# Patient Record
Sex: Female | Born: 1990 | Race: Black or African American | Hispanic: No | Marital: Married | Smoking: Never smoker
Health system: Southern US, Community
[De-identification: ages and names within clinical notes are randomized; demographics above are authoritative.]

## PROBLEM LIST (undated history)

## (undated) DIAGNOSIS — E282 Polycystic ovarian syndrome: Secondary | ICD-10-CM

## (undated) HISTORY — DX: Polycystic ovarian syndrome: E28.2

---

## 2019-07-25 ENCOUNTER — Encounter (HOSPITAL_COMMUNITY): Payer: Self-pay | Admitting: Emergency Medicine

## 2019-07-25 ENCOUNTER — Emergency Department (HOSPITAL_COMMUNITY): Payer: No Typology Code available for payment source

## 2019-07-25 ENCOUNTER — Emergency Department (HOSPITAL_COMMUNITY)
Admission: EM | Admit: 2019-07-25 | Discharge: 2019-07-25 | Disposition: A | Discharge status: Home / Self Care | Payer: No Typology Code available for payment source | Attending: Emergency Medicine | Admitting: Emergency Medicine

## 2019-07-25 DIAGNOSIS — Y990 Civilian activity done for income or pay: Secondary | ICD-10-CM | POA: Diagnosis not present

## 2019-07-25 DIAGNOSIS — R200 Anesthesia of skin: Secondary | ICD-10-CM | POA: Diagnosis not present

## 2019-07-25 DIAGNOSIS — S61012A Laceration without foreign body of left thumb without damage to nail, initial encounter: Secondary | ICD-10-CM

## 2019-07-25 DIAGNOSIS — Y939 Activity, unspecified: Secondary | ICD-10-CM | POA: Diagnosis not present

## 2019-07-25 DIAGNOSIS — Y929 Unspecified place or not applicable: Secondary | ICD-10-CM | POA: Insufficient documentation

## 2019-07-25 DIAGNOSIS — W269XXA Contact with unspecified sharp object(s), initial encounter: Secondary | ICD-10-CM | POA: Diagnosis not present

## 2019-07-25 IMAGING — CR DG FINGER THUMB 2+V*L*
3 series · 3 of 3 positions shown · non-contrast
Comparison: None.

CLINICAL DATA: Laceration

EXAM:
LEFT THUMB 2+V

[finger ap]
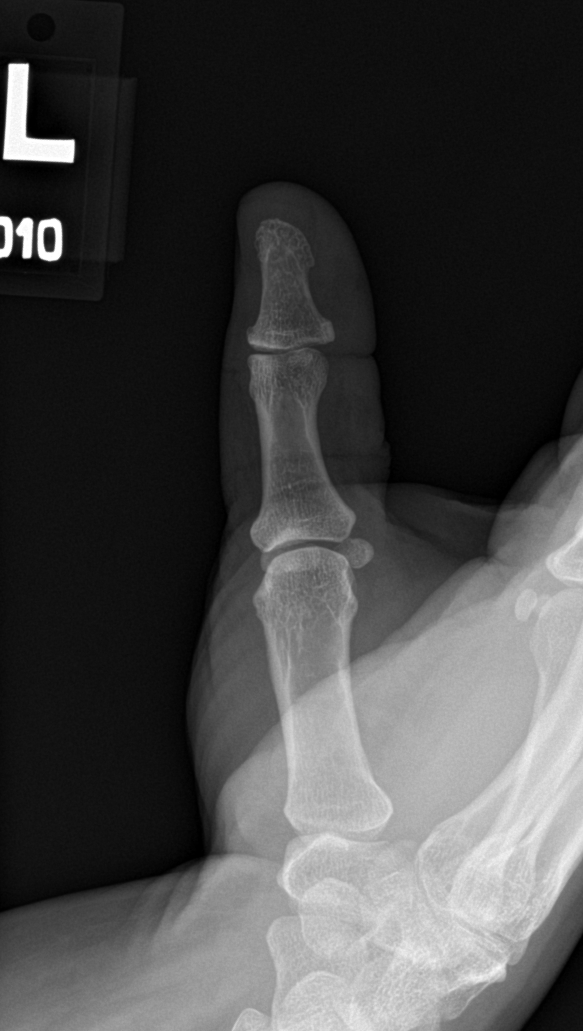

[finger obl]
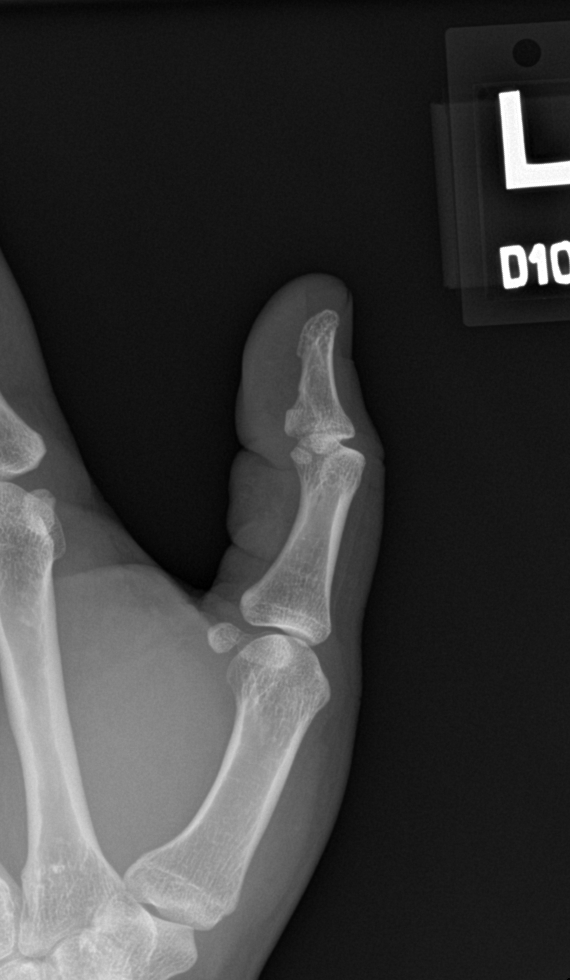

[finger lat]
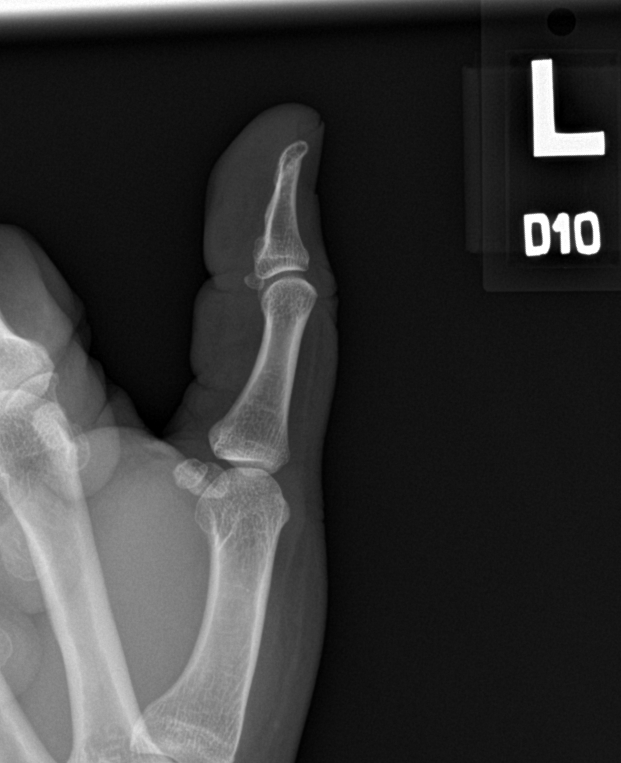

[3 of 3 positions shown; findings below may reference images not displayed]

FINDINGS: There is no evidence of fracture or dislocation. There is no
evidence of arthropathy or other focal bone abnormality. Soft
tissues are unremarkable.
IMPRESSION: Negative.

## 2019-07-25 MED ORDER — LIDOCAINE HCL (PF) 1 % IJ SOLN
10.0000 mL | Freq: Once | INTRAMUSCULAR | Status: AC
  Administered 2019-07-25: 10 mL
  Filled 2019-07-25: qty 10

## 2019-07-25 MED ORDER — ACETAMINOPHEN 500 MG PO TABS
500.0000 mg | ORAL_TABLET | Freq: Once | ORAL | Status: AC
  Administered 2019-07-25: 500 mg via ORAL
  Filled 2019-07-25: qty 1

## 2019-07-25 NOTE — ED Provider Notes (Signed)
[facility] EMERGENCY DEPARTMENT Provider Note   CSN: [id number] Arrival date & time: 07/25/19  1426     History Chief Complaint  Patient presents with  . Laceration    Lindsey Schmitt is a 29 y.o. female with no pertinent past medical history that presents to the emergency from today for a left thumb laceration that occurred a couple hours ago.  She states that she was scraped with a pan she was using at work.  She states there was a sharp edge to the pan, she denies any foreign bodies.  She states that she wrapped the wound and it stopped bleeding.  She states that her whole thumb is numb, and cannot feel anything on her thumb.  She states that she is not able to move her thumb at all due to pain.  She denies any other symptoms at this time.  Denies any shortness of breath, nausea, vomiting, chest pain, fevers, chills. Pt has gotten her tdap last year.   The history is provided by the patient.       History reviewed. No pertinent past medical history.  There are no problems to display for this patient.      OB History   No obstetric history on file.     No family history on file.  Social History   Tobacco Use  . Smoking status: Not on file  Substance Use Topics  . Alcohol use: Not on file  . Drug use: Not on file    Home Medications Prior to Admission medications   Not on File    Allergies    Amoxicillin  Review of Systems   Review of Systems  Constitutional: Negative for appetite change, chills, diaphoresis, fatigue and fever.  Eyes: Negative for pain.  Respiratory: Negative for shortness of breath.   Cardiovascular: Negative for chest pain and leg swelling.  Gastrointestinal: Negative for nausea and vomiting.  Skin: Positive for wound (Left thumb lac).  Neurological: Positive for numbness (Left thumb). Negative for tremors, weakness and light-headedness.  Psychiatric/Behavioral: Negative for behavioral problems and confusion.     Physical Exam Updated Vital Signs BP 132/86 (BP Location: Right Arm)   Pulse 74   Temp 98.6 F (37 C) (Oral)   Resp 18   SpO2 98%   Physical Exam Constitutional:      General: She is not in acute distress.    Appearance: Normal appearance. She is not ill-appearing, toxic-appearing or diaphoretic.  Cardiovascular:     Rate and Rhythm: Normal rate and regular rhythm.     Pulses: Normal pulses.     Heart sounds: Normal heart sounds.  Pulmonary:     Effort: Pulmonary effort is normal.  Musculoskeletal:        General: Swelling, tenderness and signs of injury present.     Comments: Laceration to left thumb above MCP joint.  Patient states she is unable to feel distally, however light and dull sensation distiction intact. Is able to flex and extend all thumb joints. Strength 5/5 on all thumb joints and thumb.   Bleeding is controlled.  Radial pulse 2+.  No injury elsewhere.  Normal strength of other digits, wrist, arm.  Normal sensation elsewhere.  Skin:    General: Skin is warm and dry.     Capillary Refill: Capillary refill takes less than 2 seconds.  Neurological:     General: No focal deficit present.     Mental Status: She is alert and oriented to person, place,  and time.  Psychiatric:        Mood and Affect: Mood normal.        Thought Content: Thought content normal.     ED Results / Procedures / Treatments   Labs (all labs ordered are listed, but only abnormal results are displayed) Labs Reviewed - No data to display  EKG None  Radiology DG Finger Thumb Left  Result Date: 07/25/2019 CLINICAL DATA:  Laceration EXAM: LEFT THUMB 2+V COMPARISON:  None. FINDINGS: There is no evidence of fracture or dislocation. There is no evidence of arthropathy or other focal bone abnormality. Soft tissues are unremarkable. IMPRESSION: Negative. Electronically Signed   By: Donavan Foil M.D.   On: 07/25/2019 17:27    Procedures .Nerve Block  Date/Time: 07/25/2019 5:34  PM Performed by: Lindsey Client, PA-C Authorized by: Lindsey Client, PA-C   Consent:    Consent obtained:  Verbal   Consent given by:  Patient   Risks discussed:  Allergic reaction, nerve damage, swelling, unsuccessful block, pain, intravenous injection and bleeding   Alternatives discussed:  No treatment Indications:    Indications:  Pain relief and procedural anesthesia Location:    Body area:  Upper extremity   Upper extremity nerve:  Metacarpal   Laterality:  Left Pre-procedure details:    Skin preparation:  Povidone-iodine Skin anesthesia (see MAR for exact dosages):    Skin anesthesia method:  None Procedure details (see MAR for exact dosages):    Block needle gauge:  24 G   Anesthetic injected:  Lidocaine 1% w/o epi   Steroid injected:  None   Additive injected:  None   Injection procedure:  Anatomic landmarks identified, anatomic landmarks palpated, incremental injection and introduced needle Post-procedure details:    Dressing:  None   Outcome:  Anesthesia achieved   Patient tolerance of procedure:  Tolerated well, no immediate complications .Marland KitchenLaceration Repair  Date/Time: 07/25/2019 9:55 PM Performed by: Lindsey Client, PA-C Authorized by: Lindsey Client, PA-C   Consent:    Consent obtained:  Verbal   Consent given by:  Patient   Risks discussed:  Infection, need for additional repair, pain, poor cosmetic result and poor wound healing   Alternatives discussed:  No treatment and delayed treatment Universal protocol:    Procedure explained and questions answered to patient or proxy's satisfaction: yes     Relevant documents present and verified: yes     Test results available and properly labeled: yes     Imaging studies available: yes     Required blood products, implants, devices, and special equipment available: yes     Site/side marked: yes     Immediately prior to procedure, a time out was called: yes     Patient identity confirmed:  Verbally with  patient Anesthesia (see MAR for exact dosages):    Anesthesia method:  Local infiltration Laceration details:    Location:  Finger   Finger location:  L thumb   Length (cm):  2   Depth (mm):  1 Pre-procedure details:    Preparation:  Patient was prepped and draped in usual sterile fashion and imaging obtained to evaluate for foreign bodies Exploration:    Hemostasis achieved with:  Epinephrine and direct pressure   Wound exploration: wound explored through full range of motion and entire depth of wound probed and visualized     Contaminated: no   Treatment:    Area cleansed with:  Saline   Amount of cleaning:  Extensive   Irrigation solution:  Sterile saline   Irrigation volume:  400 cc   Irrigation method:  Syringe   Visualized foreign bodies/material removed: no   Skin repair:    Repair method:  Sutures   Suture size:  5-0   Suture material:  Prolene   Suture technique:  Simple interrupted   Number of sutures:  2 Approximation:    Approximation:  Close Post-procedure details:    Dressing:  Adhesive bandage and splint for protection   (including critical care time)  Medications Ordered in ED Medications  acetaminophen (TYLENOL) tablet 500 mg (500 mg Oral Given 07/25/19 1626)  lidocaine (PF) (XYLOCAINE) 1 % injection 10 mL (10 mLs Infiltration Given 07/25/19 1905)    ED Course  I have reviewed the triage vital signs and the nursing notes.  Pertinent labs & imaging results that were available during my care of the patient were reviewed by me and considered in my medical decision making (see chart for details).    MDM Rules/Calculators/A&P                      Lumi Winslett is a 29 y.o. female with no pertinent past medical history that presents to the emergency from today for a left thumb laceration superior to the MCP joint that occurred a couple hours ago. Laceration is superficial and unlikely to have any nerve injury at this time. IS able to move all joints, normal  strength. Pt is neurovascualrly intact, is able to feel light and dull sensation. Normal pulses and strength.  Negative xray. Nerve block and sutures (See procedure note). Pt splinted.   Doubt need for further emergent work up at this time. I explained the diagnosis and have given explicit precautions to return to the ER including for any other new or worsening symptoms. The patient understands and accepts the medical plan as it's been dictated and I have answered their questions. Discharge instructions concerning home care and prescriptions have been given. The patient is STABLE and is discharged to home in good condition. Given instructions on lac repair and when to come back to remove lac. Hand surgeon referral provided with importance to follow up. Pt agreeable.      Final Clinical Impression(s) / ED Diagnoses Final diagnoses:  Laceration of left thumb without foreign body without damage to nail, initial encounter    Rx / DC Orders ED Discharge Orders    None       Farrel Gordon, PA-C 07/26/19 1213    Sabas Sous, MD 07/30/19 [phone]

## 2019-07-25 NOTE — Discharge Instructions (Addendum)
You were seen today in the emergency department for a laceration of your left thumb.  Your x-rays did not show any fractures.  We placed 2 sutures that need to be removed in about 10 days.  These can be removed at a primary care, urgent care or you can come back here for this.  Make sure to follow-up with the hand surgeon as soon as possible .  Come back to the emergency department if you are having worsening pain, still not having sensation in that finger, weakness in that finger.  Try not to use that finger for couple days.  Keep the splint on for at least 2 days.  Use a laceration care guide provided.

## 2019-07-25 NOTE — ED Notes (Signed)
Pt transported to Xray. 

## 2019-07-25 NOTE — Progress Notes (Signed)
Orthopedic Tech Progress Note Patient Details:  Lindsey Schmitt October 01, 1990 [id number]  Ortho Devices Type of Ortho Device: Finger splint Ortho Device/Splint Location: ULE Ortho Device/Splint Interventions: Application, Ordered   Post Interventions Patient Tolerated: Well   Lovett Calender 07/25/2019, 6:47 PM

## 2019-07-25 NOTE — ED Notes (Signed)
Lidocaine at bedside.

## 2019-07-25 NOTE — ED Triage Notes (Signed)
Pt arrives to ED with c/o of left hand laceration on a metal pan.

## 2019-07-25 NOTE — ED Notes (Signed)
Pt back from Xray and soaking thumb in a bucket of normal saline.

## 2020-03-18 ENCOUNTER — Emergency Department (HOSPITAL_COMMUNITY)
Admission: EM | Admit: 2020-03-18 | Discharge: 2020-03-18 | Disposition: A | Discharge status: Home / Self Care | Payer: HRSA Program | Attending: Emergency Medicine | Admitting: Emergency Medicine

## 2020-03-18 ENCOUNTER — Encounter (HOSPITAL_COMMUNITY): Payer: Self-pay

## 2020-03-18 ENCOUNTER — Other Ambulatory Visit: Payer: Self-pay

## 2020-03-18 DIAGNOSIS — Z20822 Contact with and (suspected) exposure to covid-19: Secondary | ICD-10-CM

## 2020-03-18 DIAGNOSIS — R059 Cough, unspecified: Secondary | ICD-10-CM | POA: Diagnosis present

## 2020-03-18 DIAGNOSIS — U071 COVID-19: Secondary | ICD-10-CM | POA: Diagnosis not present

## 2020-03-18 LAB — RESP PANEL BY RT-PCR (FLU A&B, COVID) ARPGX2
Influenza A by PCR: NEGATIVE
Influenza B by PCR: NEGATIVE
SARS Coronavirus 2 by RT PCR: POSITIVE — AB

## 2020-03-18 MED ORDER — BENZONATATE 100 MG PO CAPS
100.0000 mg | ORAL_CAPSULE | Freq: Three times a day (TID) | ORAL | 0 refills | Status: DC
Start: 2020-03-18 — End: 2020-09-22

## 2020-03-18 MED ORDER — KETOROLAC TROMETHAMINE 30 MG/ML IJ SOLN
30.0000 mg | Freq: Once | INTRAMUSCULAR | Status: AC
  Administered 2020-03-18: 30 mg via INTRAMUSCULAR
  Filled 2020-03-18: qty 1

## 2020-03-18 MED ORDER — ACETAMINOPHEN ER 650 MG PO TBCR
650.0000 mg | EXTENDED_RELEASE_TABLET | Freq: Three times a day (TID) | ORAL | 0 refills | Status: DC | PRN
Start: 2020-03-18 — End: 2020-09-22

## 2020-03-18 MED ORDER — ONDANSETRON 4 MG PO TBDP
4.0000 mg | ORAL_TABLET | Freq: Three times a day (TID) | ORAL | 0 refills | Status: DC | PRN
Start: 2020-03-18 — End: 2020-09-22

## 2020-03-18 NOTE — ED Notes (Signed)
Pt discharge instructions reviewed with the patient. The patient verbalized understanding of instructions. Pt discharged. 

## 2020-03-18 NOTE — ED Triage Notes (Signed)
Pt reports she didn't have her vaccinations. Pt reports her sister tested + for covid and she was around her. Pt reports she is now having cough,chills.

## 2020-03-18 NOTE — ED Provider Notes (Signed)
[facility] EMERGENCY DEPARTMENT Provider Note   CSN: [id number] Arrival date & time: 03/18/20  1253     History Chief Complaint  Patient presents with  . Covid Exposure    Lindsey Schmitt is a 29 y.o. female with no significant past medical history presents to the ED due to dry cough and chills x1 day.  Patient states her sister tested positive for Covid yesterday who she was around on Christmas.  Patient has not received her Covid vaccine.  Admits to intermittent subjective fevers over the past 24 hours.  Denies shortness of breath and chest pain.  Denies abdominal pain, nausea, vomiting, and diarrhea.  She has taken ibuprofen and DayQuil with moderate relief.  She also admits to a bilateral frontal, throbbing headache.  Denies associated visual changes, changes to speech, dizziness, and unilateral weakness.  No aggravating or alleviating factors.  History obtained from patient and past medical records. No interpreter used during encounter.      History reviewed. No pertinent past medical history.  There are no problems to display for this patient.   History reviewed. No pertinent surgical history.   OB History   No obstetric history on file.     History reviewed. No pertinent family history.  Social History   Tobacco Use  . Smoking status: Never Smoker  . Smokeless tobacco: Never Used    Home Medications Prior to Admission medications   Medication Sig Start Date End Date Taking? Authorizing Provider  acetaminophen (TYLENOL 8 HOUR) 650 MG CR tablet Take 1 tablet (650 mg total) by mouth every 8 (eight) hours as needed for pain. 03/18/20  Yes Semaja Lymon, Merla Riches, PA-C  benzonatate (TESSALON) 100 MG capsule Take 1 capsule (100 mg total) by mouth every 8 (eight) hours. 03/18/20  Yes Bennetta Rudden C, PA-C  ondansetron (ZOFRAN ODT) 4 MG disintegrating tablet Take 1 tablet (4 mg total) by mouth every 8 (eight) hours as needed for nausea or vomiting.  03/18/20  Yes Shaila Gilchrest, Merla Riches, PA-C    Allergies    Amoxicillin  Review of Systems   Review of Systems  Constitutional: Positive for chills and fever.  HENT: Negative for congestion and sore throat.   Respiratory: Positive for cough. Negative for shortness of breath.   Cardiovascular: Negative for chest pain and leg swelling.  Gastrointestinal: Negative for abdominal pain, diarrhea, nausea and vomiting.  Neurological: Positive for headaches. Negative for dizziness, facial asymmetry and speech difficulty.  All other systems reviewed and are negative.   Physical Exam Updated Vital Signs BP 124/79 (BP Location: Left Arm)   Pulse (!) 102   Temp 99.4 F (37.4 C) (Oral)   Resp 18   LMP 12/18/2019   SpO2 99%   Physical Exam Vitals and nursing note reviewed.  Constitutional:      General: She is not in acute distress.    Appearance: She is not ill-appearing.  HENT:     Head: Normocephalic.  Eyes:     Pupils: Pupils are equal, round, and reactive to light.  Neck:     Comments: No meningismus. Cardiovascular:     Rate and Rhythm: Normal rate and regular rhythm.     Pulses: Normal pulses.     Heart sounds: Normal heart sounds. No murmur heard. No friction rub. No gallop.   Pulmonary:     Effort: Pulmonary effort is normal.     Breath sounds: Normal breath sounds.     Comments: Respirations equal and unlabored, patient able  to speak in full sentences, lungs clear to auscultation bilaterally Abdominal:     General: Abdomen is flat. There is no distension.     Palpations: Abdomen is soft.     Tenderness: There is no abdominal tenderness. There is no guarding or rebound.  Musculoskeletal:     Cervical back: Neck supple.     Comments: Able to move all 4 extremities without difficulty.   Skin:    General: Skin is warm and dry.  Neurological:     General: No focal deficit present.     Mental Status: She is alert.     Comments: Speech is clear, able to follow commands CN  III-XII intact Normal strength in upper and lower extremities bilaterally including dorsiflexion and plantar flexion, strong and equal grip strength Sensation grossly intact throughout Moves extremities without ataxia, coordination intact No pronator drift Ambulates without difficulty  Psychiatric:        Mood and Affect: Mood normal.        Behavior: Behavior normal.     ED Results / Procedures / Treatments   Labs (all labs ordered are listed, but only abnormal results are displayed) Labs Reviewed  RESP PANEL BY RT-PCR (FLU A&B, COVID) [id number]    EKG None  Radiology No results found.  Procedures Procedures (including critical care time)  Medications Ordered in ED Medications  ketorolac (TORADOL) 30 MG/ML injection 30 mg (has no administration in time range)    ED Course  I have reviewed the triage vital signs and the nursing notes.  Pertinent labs & imaging results that were available during my care of the patient were reviewed by me and considered in my medical decision making (see chart for details).    MDM Rules/Calculators/A&P                         29 year old female presents to the ED due to cough, chills, and headache x1 day.  Patient was recently around her sister who tested positive for Covid yesterday.  She has not received her Covid vaccine.  Patient denies chest pain and shortness of breath.  Upon arrival, patient afebrile.  Triage noted patient to be tachycardic at 102 however, during my initial evaluation patient's heart rate in the 90s.  Patient maintaining O2 saturation at 99% on room air.  Patient in no acute distress and nontoxic-appearing.  Physical exam reassuring.  No meningismus to suggest meningitis.  Lungs clear to auscultation bilaterally with no rales, rhonchi, or wheeze.  Low suspicion for pneumonia.  Normal neurological exam.  Low suspicion for [facility] or other emergent intracranial abnormalities. No imaging warranted at this time.  Suspect symptoms  related to Covid infection vs other viral etiology. Toradol given for headache here in the ED. COVID/influenza test pending.  Patient discharged with symptomatic treatment.  Quarantine guidelines discussed with patient if Covid test positive. Strict ED precautions discussed with patient. Patient states understanding and agrees to plan. Patient discharged home in no acute distress and stable vitals.  Lindsey Schmitt was evaluated in Emergency Department on 03/18/2020 for the symptoms described in the history of present illness. She was evaluated in the context of the global COVID-19 pandemic, which necessitated consideration that the patient might be at risk for infection with the SARS-CoV-2 virus that causes COVID-19. Institutional protocols and algorithms that pertain to the evaluation of patients at risk for COVID-19 are in a state of rapid change based on information released by regulatory bodies including  the [employer] and federal and state organizations. These policies and algorithms were followed during the patient's care in the ED.  Final Clinical Impression(s) / ED Diagnoses Final diagnoses:  Close exposure to COVID-19 virus  Cough    Rx / DC Orders ED Discharge Orders         Ordered    benzonatate (TESSALON) 100 MG capsule  Every 8 hours        03/18/20 1447    ondansetron (ZOFRAN ODT) 4 MG disintegrating tablet  Every 8 hours PRN        03/18/20 1447    acetaminophen (TYLENOL 8 HOUR) 650 MG CR tablet  Every 8 hours PRN        03/18/20 1447           Jesusita Oka 03/18/20 1503    Pricilla Loveless, MD 03/20/20 [phone]

## 2020-03-18 NOTE — Discharge Instructions (Addendum)
As discussed, your Covid test is pending.  Continue to self quarantine until your COVID results become available.  If your Covid test is positive he was self quarantine away from others for 10 days since symptom onset.  I am sending home with cough medication, nausea medication, and Tylenol.  Take as needed.  Please follow-up with PCP if symptoms do not improve within the next week.  Return to the ER for new or worsening symptoms.

## 2020-09-22 ENCOUNTER — Other Ambulatory Visit: Payer: Self-pay

## 2020-09-22 ENCOUNTER — Emergency Department (HOSPITAL_COMMUNITY): Payer: Self-pay

## 2020-09-22 ENCOUNTER — Encounter (HOSPITAL_COMMUNITY): Payer: Self-pay | Admitting: Emergency Medicine

## 2020-09-22 ENCOUNTER — Emergency Department (HOSPITAL_COMMUNITY)
Admission: EM | Admit: 2020-09-22 | Discharge: 2020-09-22 | Disposition: A | Discharge status: Home / Self Care | Payer: Self-pay | Attending: Emergency Medicine | Admitting: Emergency Medicine

## 2020-09-22 DIAGNOSIS — Z20822 Contact with and (suspected) exposure to covid-19: Secondary | ICD-10-CM | POA: Insufficient documentation

## 2020-09-22 DIAGNOSIS — G43809 Other migraine, not intractable, without status migrainosus: Secondary | ICD-10-CM | POA: Insufficient documentation

## 2020-09-22 LAB — CBC WITH DIFFERENTIAL/PLATELET
Abs Immature Granulocytes: 0.02 10*3/uL (ref 0.00–0.07)
Basophils Absolute: 0 10*3/uL (ref 0.0–0.1)
Basophils Relative: 1 %
Eosinophils Absolute: 0.1 10*3/uL (ref 0.0–0.5)
Eosinophils Relative: 1 %
HCT: 38.1 % (ref 36.0–46.0)
Hemoglobin: 12 g/dL (ref 12.0–15.0)
Immature Granulocytes: 0 %
Lymphocytes Relative: 17 %
Lymphs Abs: 1.4 10*3/uL (ref 0.7–4.0)
MCH: 27.2 pg (ref 26.0–34.0)
MCHC: 31.5 g/dL (ref 30.0–36.0)
MCV: 86.4 fL (ref 80.0–100.0)
Monocytes Absolute: 0.5 10*3/uL (ref 0.1–1.0)
Monocytes Relative: 6 %
Neutro Abs: 6 10*3/uL (ref 1.7–7.7)
Neutrophils Relative %: 75 %
Platelets: 296 10*3/uL (ref 150–400)
RBC: 4.41 MIL/uL (ref 3.87–5.11)
RDW: 12.4 % (ref 11.5–15.5)
WBC: 8 10*3/uL (ref 4.0–10.5)
nRBC: 0 % (ref 0.0–0.2)

## 2020-09-22 LAB — BASIC METABOLIC PANEL
Anion gap: 8 (ref 5–15)
BUN: 14 mg/dL (ref 6–20)
CO2: 26 mmol/L (ref 22–32)
Calcium: 9.8 mg/dL (ref 8.9–10.3)
Chloride: 106 mmol/L (ref 98–111)
Creatinine, Ser: 0.48 mg/dL (ref 0.44–1.00)
GFR, Estimated: >60 mL/min (ref >60)
Glucose, Bld: 95 mg/dL (ref 70–99)
Potassium: 4 mmol/L (ref 3.5–5.1)
Sodium: 140 mmol/L (ref 135–145)

## 2020-09-22 LAB — RESP PANEL BY RT-PCR (FLU A&B, COVID) ARPGX2
Influenza A by PCR: NEGATIVE
Influenza B by PCR: NEGATIVE
SARS Coronavirus 2 by RT PCR: NEGATIVE

## 2020-09-22 IMAGING — CT CT HEAD W/O CM
3 series · 15 of 47 positions shown, 18 images · non-contrast
Comparison: None.

CLINICAL DATA: 30-year-old female with acute severe headache for 1
week.

EXAM:
CT HEAD WITHOUT CONTRAST
TECHNIQUE: Contiguous axial images were obtained from the base of the skull
through the vertex without intravenous contrast.

[Series 2: head wo · axial · 0.43mm/px · z∈[+1475,+1600]mm · 9 of 31 slices shown, 12 images]
[im 3/31  brain]
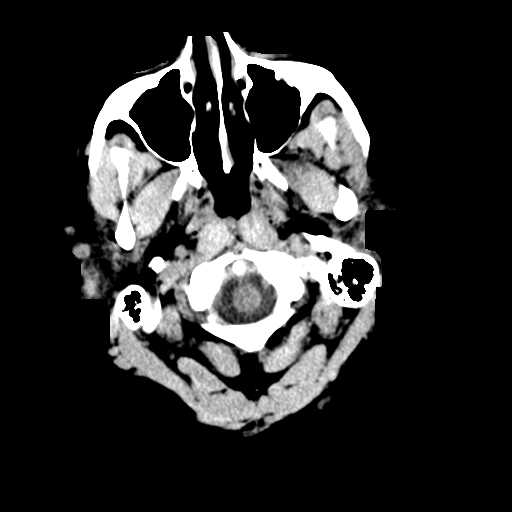
[im 3/31  bone]
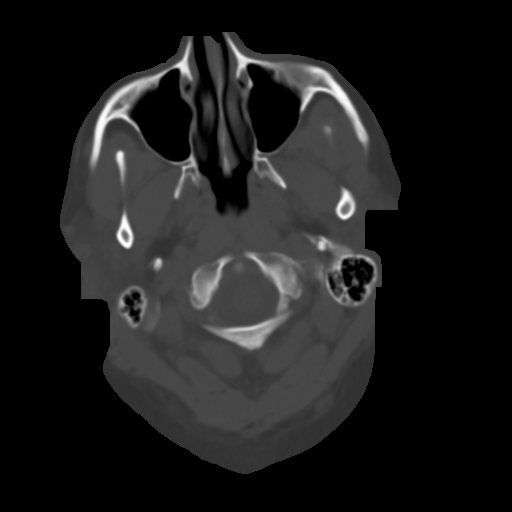
[im 6/31  brain]
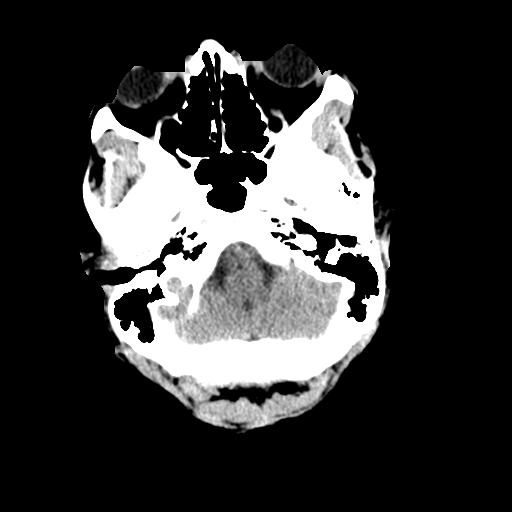
[im 9/31  brain]
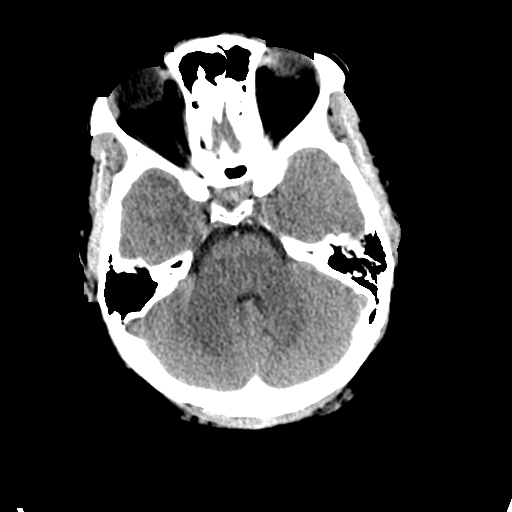
[im 12/31  brain]
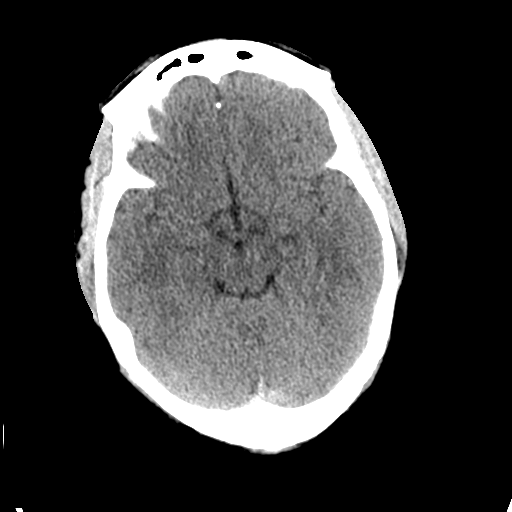
[im 16/31  brain]
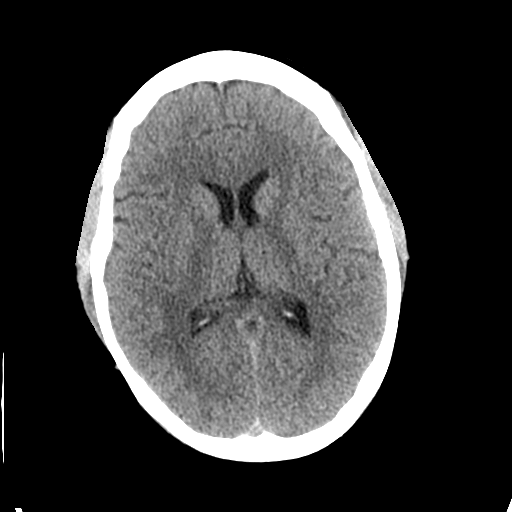
[im 16/31  bone]
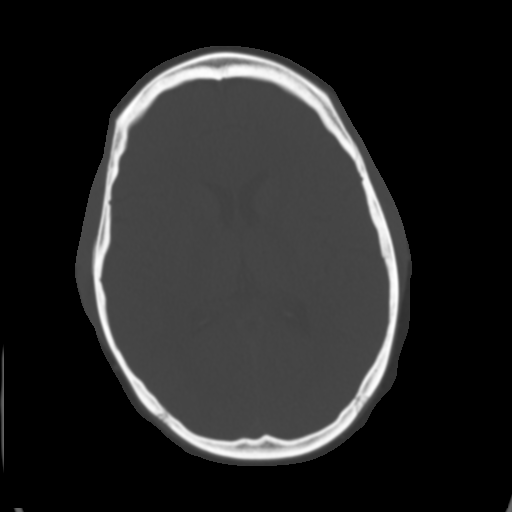
[im 19/31  brain]
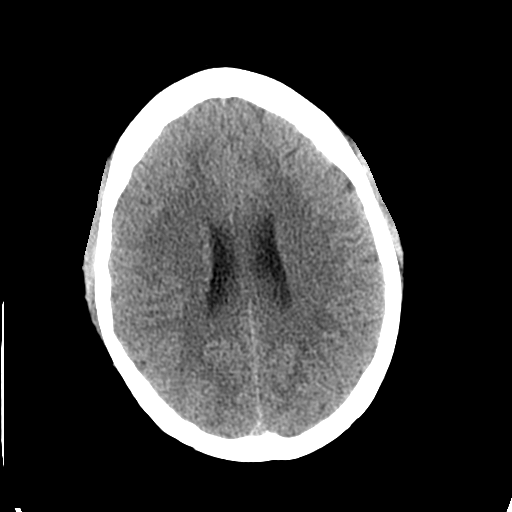
[im 22/31  brain]
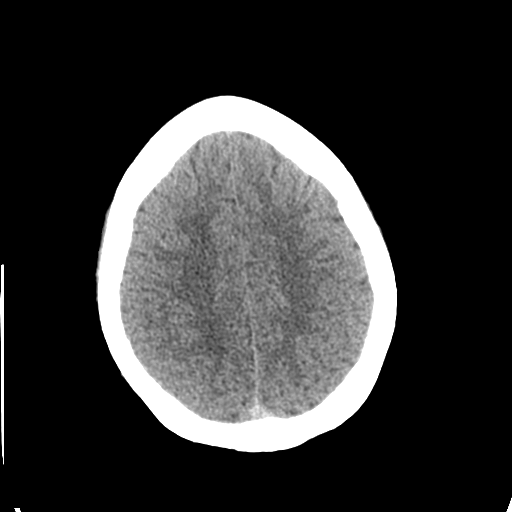
[im 25/31  brain]
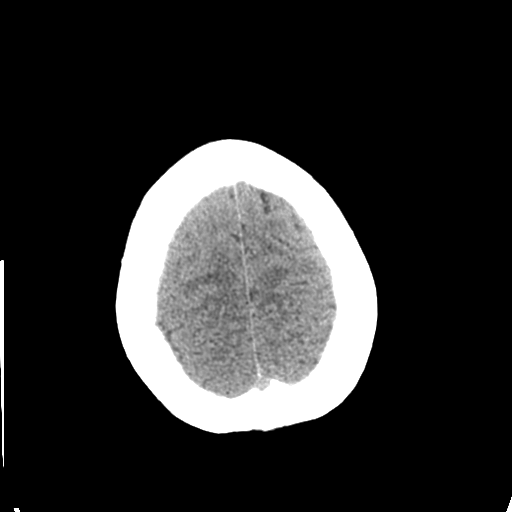
[im 28/31  brain]
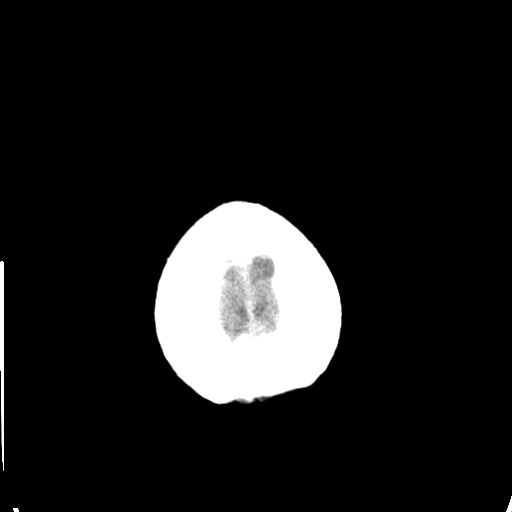
[im 28/31  bone]
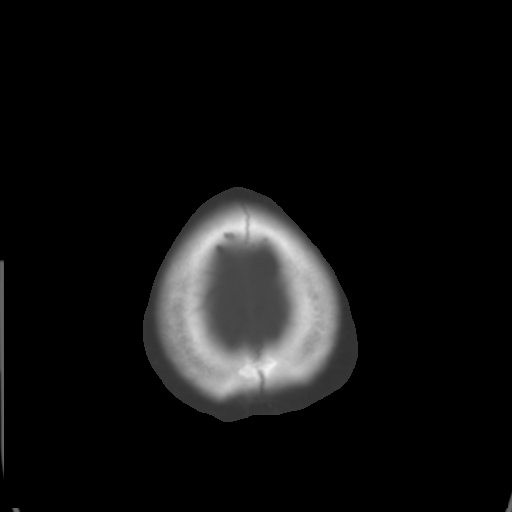

[Series 5: coronal soft tissue · coronal · 0.34mm/px · 3 of 75 slices shown]
[im 25/75  brain]
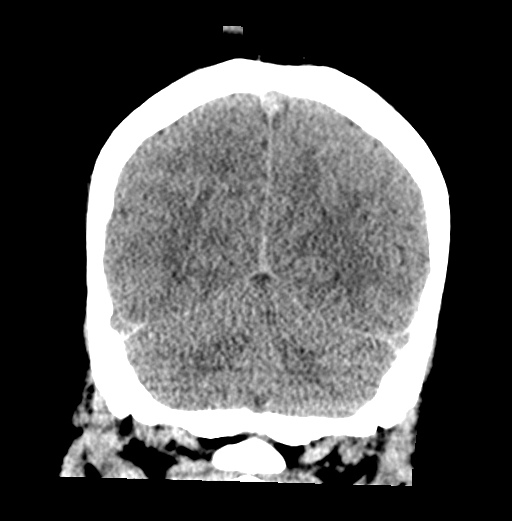
[im 33/75  brain]
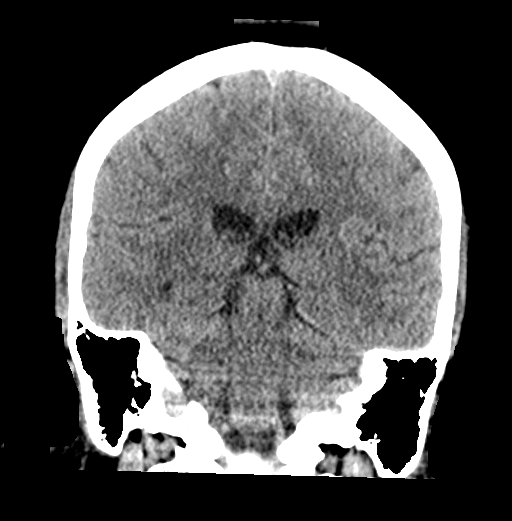
[im 42/75  brain]
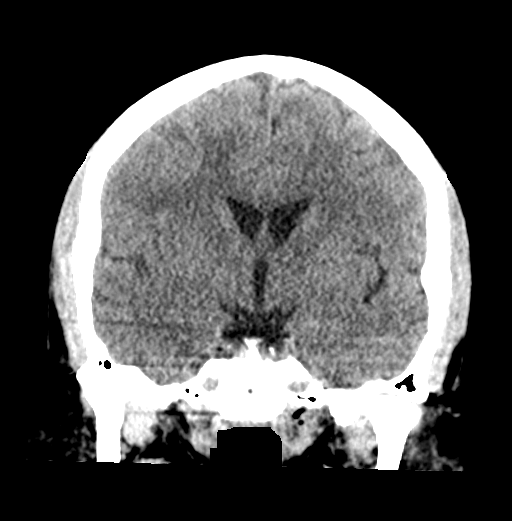

[Series 6: sagittal soft tissue · sagittal · 0.30mm/px · 3 of 58 slices shown]
[im 20/58  brain]
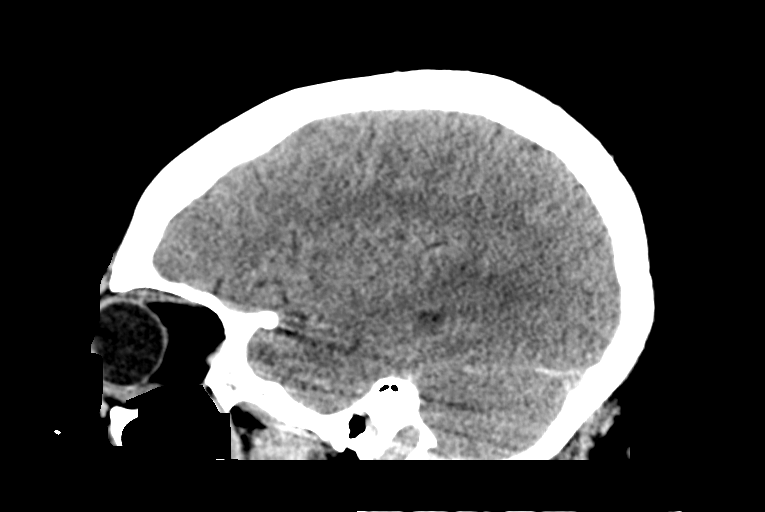
[im 29/58  brain]
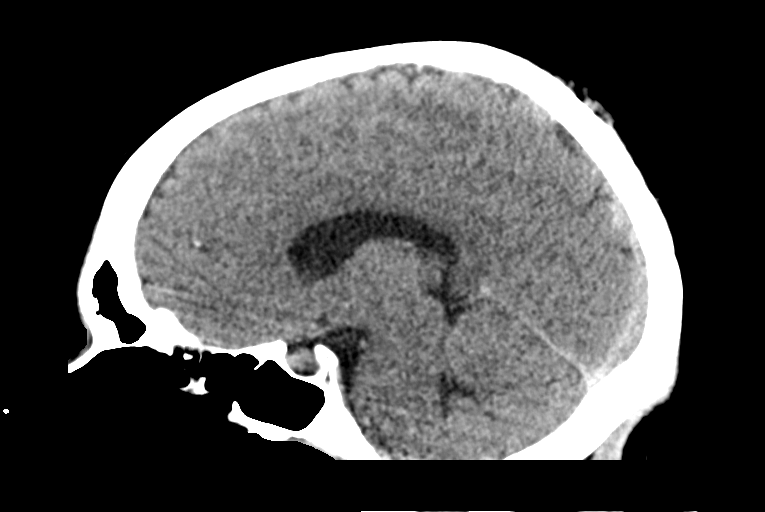
[im 39/58  brain]
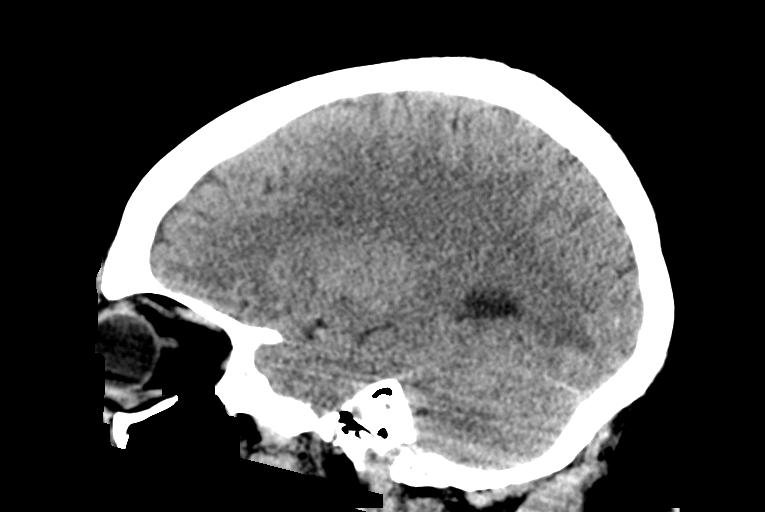

[15 of 47 positions shown; findings below may reference images not displayed]

FINDINGS: Brain: No evidence of acute infarction, hemorrhage, hydrocephalus,
extra-axial collection or mass lesion/mass effect.

Vascular: No hyperdense vessel or unexpected calcification.

Skull: Normal. Negative for fracture or focal lesion.

Sinuses/Orbits: No acute finding.

Other: None.
IMPRESSION: Unremarkable noncontrast head CT.

## 2020-09-22 MED ORDER — DEXAMETHASONE SODIUM PHOSPHATE 4 MG/ML IJ SOLN
4.0000 mg | Freq: Once | INTRAMUSCULAR | Status: AC
  Administered 2020-09-22: 4 mg via INTRAVENOUS
  Filled 2020-09-22: qty 1

## 2020-09-22 MED ORDER — DIPHENHYDRAMINE HCL 25 MG PO TABS
25.0000 mg | ORAL_TABLET | Freq: Three times a day (TID) | ORAL | 0 refills | Status: DC | PRN
Start: 2020-09-22 — End: 2021-06-24

## 2020-09-22 MED ORDER — DIPHENHYDRAMINE HCL 50 MG/ML IJ SOLN
25.0000 mg | Freq: Once | INTRAMUSCULAR | Status: AC
  Administered 2020-09-22: 25 mg via INTRAVENOUS
  Filled 2020-09-22: qty 1

## 2020-09-22 MED ORDER — IBUPROFEN 600 MG PO TABS: 600.0000 mg | ORAL_TABLET | Freq: Four times a day (QID) | ORAL | 0 refills | Status: DC | PRN

## 2020-09-22 MED ORDER — ACETAMINOPHEN 325 MG PO TABS: 650.0000 mg | ORAL_TABLET | Freq: Four times a day (QID) | ORAL | 0 refills | Status: DC | PRN

## 2020-09-22 MED ORDER — PROCHLORPERAZINE MALEATE 10 MG PO TABS: 10.0000 mg | ORAL_TABLET | Freq: Three times a day (TID) | ORAL | 0 refills | Status: AC | PRN

## 2020-09-22 MED ORDER — KETOROLAC TROMETHAMINE 15 MG/ML IJ SOLN
15.0000 mg | Freq: Once | INTRAMUSCULAR | Status: AC
  Administered 2020-09-22: 15 mg via INTRAVENOUS
  Filled 2020-09-22: qty 1

## 2020-09-22 MED ORDER — METOCLOPRAMIDE HCL 5 MG/ML IJ SOLN
10.0000 mg | Freq: Once | INTRAMUSCULAR | Status: AC
  Administered 2020-09-22: 10 mg via INTRAVENOUS
  Filled 2020-09-22: qty 2

## 2020-09-22 NOTE — ED Triage Notes (Signed)
Patient is ambulatory to room 6- patient co having a "migraine" for 3 days with nausea.  Patient reports having been diagnosed with migraines in the past.

## 2020-09-22 NOTE — ED Provider Notes (Signed)
[facility] Provider Note   CSN: [id number] Arrival date & time: 09/22/20  5830     History Chief Complaint  Patient presents with   Migraine    Lindsey Schmitt is a 30 y.o. female w/ hx of PCOS presenting to emergency department the headache.  She reports very gradual onset of her headache approximately 6 days ago.  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  She says it was a frontal headache that began gradually worsened over 2 days.  For the past several days it has been persistent and throbbing.  It is worse in the front of her head.  Is worse with standing up.  Better with lying down.  It is responded minimally to over-the-counter medicines at home.  She has never had a headache like this before.  She does report a distant history of migraines as a child, but says that was related to her bad vision, this went away when she fixed it.  She has not had migraines since then.  She does report some nausea with this headache.  There is no reported falls or trauma.  She denies any other medical problems.  She is not diabetic.  She is not on any medications including any hormones.  She is allergic to amoxicillin.  HPI     History reviewed. No pertinent past medical history.  There are no problems to display for this patient.   History reviewed. No pertinent surgical history.   OB History   No obstetric history on file.     History reviewed. No pertinent family history.  Social History   Tobacco Use   Smoking status: Never   Smokeless tobacco: Never    Home Medications Prior to Admission medications   Medication Sig Start Date End Date Taking? Authorizing Provider  acetaminophen (TYLENOL) 325 MG tablet Take 2 tablets (650 mg total) by mouth every 6 (six) hours as needed for up to 30 doses. 09/22/20  Yes Aylee Littrell, Kermit Balo, MD  diphenhydrAMINE (BENADRYL) 25 MG tablet Take 1 tablet (25 mg total) by mouth every 8 (eight) hours as needed for  up to 21 doses. 09/22/20  Yes Terald Sleeper, MD  ELDERBERRY PO Take 1 tablet by mouth daily.   Yes [provider]  ibuprofen (ADVIL) 200 MG tablet Take 800 mg by mouth every 6 (six) hours as needed for fever, headache or mild pain.   Yes [provider]  ibuprofen (ADVIL) 600 MG tablet Take 1 tablet (600 mg total) by mouth every 6 (six) hours as needed for up to 30 doses for mild pain or moderate pain. 09/22/20  Yes Koby Pickup, Kermit Balo, MD  prochlorperazine (COMPAZINE) 10 MG tablet Take 1 tablet (10 mg total) by mouth every 8 (eight) hours as needed for up to 12 doses for nausea or vomiting (headache). 09/22/20  Yes Terald Sleeper, MD    Allergies    Amoxicillin  Review of Systems   Review of Systems  Constitutional:  Negative for chills and fever.  Eyes:  Positive for photophobia. Negative for visual disturbance.  Respiratory:  Negative for cough and shortness of breath.   Cardiovascular:  Negative for chest pain and palpitations.  Gastrointestinal:  Positive for nausea. Negative for abdominal pain.  Musculoskeletal:  Negative for arthralgias and neck stiffness.  Skin:  Negative for color change and rash.  Neurological:  Positive for light-headedness and headaches. Negative for syncope.  All other systems reviewed and are negative.  Physical Exam Updated Vital Signs BP 125/84 (BP Location: Left Arm)   Pulse 77   Temp 98.3 F (36.8 C) (Oral)   Resp 18   Ht 5\' 2"  (1.575 m)   Wt 95.3 kg   LMP  (LMP Unknown) Comment: Patient reports having PCOS- had a period 1 year ago  SpO2 100%   BMI 38.41 kg/m   Physical Exam Constitutional:      General: She is not in acute distress. HENT:     Head: Normocephalic and atraumatic.     Comments: Significant frontal sinus tenderness Eyes:     Conjunctiva/sclera: Conjunctivae normal.     Pupils: Pupils are equal, round, and reactive to light.  Cardiovascular:     Rate and Rhythm: Normal rate and regular rhythm.  Pulmonary:      Effort: Pulmonary effort is normal. No respiratory distress.  Abdominal:     General: There is no distension.     Tenderness: There is no abdominal tenderness.  Skin:    General: Skin is warm and dry.  Neurological:     General: No focal deficit present.     Mental Status: She is alert and oriented to person, place, and time. Mental status is at baseline.     Sensory: No sensory deficit.     Motor: No weakness.  Psychiatric:        Mood and Affect: Mood normal.        Behavior: Behavior normal.    ED Results / Procedures / Treatments   Labs (all labs ordered are listed, but only abnormal results are displayed) Labs Reviewed  RESP PANEL BY RT-PCR (FLU A&B, COVID) ARPGX2  BASIC METABOLIC PANEL  CBC WITH DIFFERENTIAL/PLATELET    EKG None  Radiology CT Head Wo Contrast  Result Date: 09/22/2020 CLINICAL DATA:  30 year old female with acute severe headache for 1 week. EXAM: CT HEAD WITHOUT CONTRAST TECHNIQUE: Contiguous axial images were obtained from the base of the skull through the vertex without intravenous contrast. COMPARISON:  None. FINDINGS: Brain: No evidence of acute infarction, hemorrhage, hydrocephalus, extra-axial collection or mass lesion/mass effect. Vascular: No hyperdense vessel or unexpected calcification. Skull: Normal. Negative for fracture or focal lesion. Sinuses/Orbits: No acute finding. Other: None. IMPRESSION: Unremarkable noncontrast head CT. Electronically Signed   By: 26 M.D.   On: 09/22/2020 09:46    Procedures Procedures   Medications Ordered in ED Medications  ketorolac (TORADOL) 15 MG/ML injection 15 mg (15 mg Intravenous Given 09/22/20 0912)  metoCLOPramide (REGLAN) injection 10 mg (10 mg Intravenous Given 09/22/20 0913)  dexamethasone (DECADRON) injection 4 mg (4 mg Intravenous Given 09/22/20 0909)  diphenhydrAMINE (BENADRYL) injection 25 mg (25 mg Intravenous Given 09/22/20 0911)    ED Course  I have reviewed the triage vital signs and  the nursing notes.  Pertinent labs & imaging results that were available during my care of the patient were reviewed by me and considered in my medical decision making (see chart for details).  This patient presents to the Emergency Department with complaint of headache.  This involves an extensive number of treatment options, and is a complaint that carries with it a high risk of complications and morbidity.  The differential diagnosis for headache includes tension type headache vs occipital headache vs complex migraine vs sinusitis vs other  I have a lower suspicion for subarachnoid hemorrhage intracranial bleed given the gradual onset of her headache.    Likewise a seems less likely to be increased intracranial pressure as  her symptoms are worse with standing up, better with lying down.  They are not worse necessarily in the morning.  No temporal tenderness.  Doubt temporal arteritis.  No nuchal rigidity or fevers after 7 days of symptoms.  This is less likely bacterial meningitis.  She is interested in a COVID PCR which we can do.  I ordered, reviewed, and interpreted labs, including BMP and CBC.  There were no immediate, life-threatening emergencies found in this labwork.  Covid negative. I ordered medication for headache and/or nausea I ordered imaging studies which included CTH I independently visualized and interpreted imaging which showed no acute abnormalities, and the monitor tracing which showed NSR   Based on the patient's clinical exam, vital signs, risk factors, and ED testing, I felt that the patient's overall risk of life-threatening emergency such as ICH, meningitis, intracranial mass or tumor was quite low.  I suspect this clinical presentation is most consistent with migraine vs caffeine withdrawal headache, but explained to the patient that this evaluation was not a definitive diagnostic workup.  I discussed outpatient follow up with primary care provider, and provided  specialist office number on the patient's discharge paper if a referral was deemed necessary.  I discussed return precautions with the patient. I felt the patient was clinically stable for discharge.   Clinical Course as of 09/22/20 1646  Sun Sep 22, 2020  1143 She is feeling significantly better after the medications.  Her husband is now present at bedside.  We can discharge her home with some continued medications for headache.  She did discuss with the fact that she has been a longtime drinker of coffee, but did stop drinking coffee approximately 2 days before symptoms started.  She has not had any coffee all week.  We discussed the possibility of caffeine withdrawal headaches.  I will place a referral for neurology follow-up. [MT]    Clinical Course User Index [MT] Donelle Hise, Kermit Balo, MD    Final Clinical Impression(s) / ED Diagnoses Final diagnoses:  Other migraine without status migrainosus, not intractable    Rx / DC Orders ED Discharge Orders          Ordered    Ambulatory referral to Neurology       Comments: An appointment is requested in approximately: 1 week   09/22/20 1146    prochlorperazine (COMPAZINE) 10 MG tablet  Every 8 hours PRN        09/22/20 1146    ibuprofen (ADVIL) 600 MG tablet  Every 6 hours PRN        09/22/20 1146    acetaminophen (TYLENOL) 325 MG tablet  Every 6 hours PRN        09/22/20 1146    diphenhydrAMINE (BENADRYL) 25 MG tablet  Every 8 hours PRN        09/22/20 1147             Brelynn Wheller, Kermit Balo, MD 09/22/20 1646

## 2020-09-22 NOTE — Discharge Instructions (Addendum)
You need to follow-up with neurology for this.  Please refer to the number above.  For the next few days, for your headache you should start by taking ibuprofen and Tylenol every 6 hours as needed.  If this is not helping enough, you can also take a Compazine tablet.  If this is not enough, you can take a benadryl as well.  This will make you drowsy - it's better for bed time.  We talked about the possibility of caffeine withdrawal.  You can consider trying a small cup of coffee or caffeine each day.  If this seems to help your headache, you can continue drinking a small cup of coffee each day.  You can follow-up with a neurologist for this.  Please review the information about migraine headaches pain particular attention to reasons to come back to the ER.

## 2020-09-24 ENCOUNTER — Encounter: Payer: Self-pay | Admitting: Neurology

## 2020-11-02 ENCOUNTER — Encounter (HOSPITAL_COMMUNITY): Payer: Self-pay

## 2020-11-02 ENCOUNTER — Other Ambulatory Visit: Payer: Self-pay

## 2020-11-02 ENCOUNTER — Emergency Department (HOSPITAL_COMMUNITY)
Admission: EM | Admit: 2020-11-02 | Discharge: 2020-11-02 | Disposition: A | Discharge status: Home / Self Care | Payer: Self-pay | Attending: Emergency Medicine | Admitting: Emergency Medicine

## 2020-11-02 DIAGNOSIS — Z5321 Procedure and treatment not carried out due to patient leaving prior to being seen by health care provider: Secondary | ICD-10-CM | POA: Insufficient documentation

## 2020-11-02 DIAGNOSIS — R209 Unspecified disturbances of skin sensation: Secondary | ICD-10-CM | POA: Insufficient documentation

## 2020-11-02 NOTE — ED Notes (Signed)
I have informed this pt of the visitor policy.

## 2020-11-02 NOTE — ED Notes (Signed)
Pt informed me that she is leaving and wishes to be checked out. Pt states she will follow up with her neurologist. Triage RN notified.

## 2020-11-02 NOTE — ED Provider Notes (Cosign Needed)
Emergency Medicine Provider Triage Evaluation Note  Lindsey Schmitt , a 30 y.o. female  was evaluated in triage.  Pt complains of decreased sensation of the right sided of her head, her right lower face, right posterior scalp and right shoulder.  She states this started about 48 hours ago.  She denies chiropractic manipulations.   Review of Systems  Positive: Decreased sensation across the right sided lower face, the right neck (anterior ant posterior), decreased sensation to right superior shoulder. Negative: Weakness, vision changes.   Physical Exam  BP (!) 148/79 (BP Location: Left Arm)   Pulse 81   Temp 98.1 F (36.7 C) (Oral)   Resp 18   Ht 5\' 2"  (1.575 m)   Wt 96.2 kg   SpO2 100%   BMI 38.78 kg/m  Gen:   Awake, no distress   Resp:  Normal effort  MSK:   Moves extremities without difficulty  Other:  No facial droop.  Speech is nonslurred.  Subjective decrease sensation to light touch across the right-sided mid and lower face.  Sensation is normal to light touch across the right Upper Face/Forehead.  There Is Subjective Decrease Sensation to Light Touch across the Right-Sided Anterior, Lateral, and Posterior Neck, and along the Superior Aspect of the Right Shoulder  Medical Decision Making  Medically screening exam initiated at 9:27 PM.  Appropriate orders placed.  was informed that the remainder of the evaluation will be completed by another provider, this initial triage assessment does not replace that evaluation, and the importance of remaining in the ED until their evaluation is complete.  Note: Portions of this report may have been transcribed using voice recognition software. Every effort was made to ensure accuracy; however, inadvertent computerized transcription errors may be present  Outside of any stroke window.     Barbera Setters, Cristina Gong 11/02/20 2130

## 2020-11-02 NOTE — ED Triage Notes (Signed)
Pt complains of numbness to the right side of her head and shoulder since Thursday. Pt states that her Neurologist told her to come here.

## 2020-11-02 NOTE — ED Notes (Signed)
Pt ambulatory in ED lobby. 

## 2020-12-13 NOTE — Progress Notes (Signed)
NEUROLOGY CONSULTATION NOTE  Lindsey Schmitt MRN: [id number] DOB: [birth year]  Referring provider: Alvester Chou, MD (ED referral) Primary care provider: No PCP  Reason for consult:  headache  Assessment/Plan:   Migraine with prolonged aura (hemisensory loss).  Due to prolonged aura of stroke like symptoms, triptans are contraindicated.  She needs MRI of brain.  Unfortunately, she currently has no insurance.  I discussed signing up for the [facility].  She said that she will be added to her fiance's insurance at the new year after she gets married and will wait until then.  When she has coverage, she will contact us and we can then order MRI of brain with and without contrast Migraine prevention:  start propranolol 40mg  twice daily.  Cautioned for dizziness.   Migraine rescue:  Provided her samples of Nurtec.  Limit use of pain relievers to no more than 2 days out of week to prevent risk of rebound or medication-overuse headache. Keep headache diary Follow up 6 months.    Subjective:  Lindsey Schmitt is a 30 year old female with PCOS who presents for headache.  History supplemented by ED note.  She has had migraines since age 26.  It is typically a severe diffuse pressure headache with nausea, photophobia, phonophobia, blurred vision and aggravated by movement.  Typically lasts 3 hours with ibuprofen and occurs 3 times a month.    In late June, she began experiencing numbness on the left side of her head, face and down to the shoulder.  No pain or weakness.  After a couple of weeks, on 09/22/2020, she developed one of her migraine headaches.  She said she also passed out.  She went to the ED where CT head personally reviewed was unremarkable.  She was diagnosed with migraine and treated with a headache cocktail.  However, symptoms lasted for another week.  She had another episode on 11/02/2020 but this time she developed total right hemisensory loss from head  to toe.  No weakness.  She developed severe headache.  The numbness lasted for an entire month before resolving.  She does report increased stress planning for her wedding.    Current NSAIDS/analgesics:  ibuprofen Current triptans:  none Current ergotamine:  none Current anti-emetic:  none Current muscle relaxants:  none Current Antihypertensive medications:  none Current Antidepressant medications:  none Current Anticonvulsant medications:  none Current anti-CGRP:  none Current Vitamins/Herbal/Supplements:  none Current Antihistamines/Decongestants:  none Other therapy:  none Hormone/birth control:  none   Past NSAIDS/analgesics:  Tylenol Past abortive triptans:  none Past abortive ergotamine:  none Past muscle relaxants:  none Past anti-emetic:  none Past antihypertensive medications:  none Past antidepressant medications:  none Past anticonvulsant medications:  none Past anti-CGRP:  none Past vitamins/Herbal/Supplements:  none Past antihistamines/decongestants:  none Other past therapies:  none  Caffeine:  2 cups iced coffee daily every other day Diet:  Hydrates.  No soda. Exercise:  no Depression:  no; Anxiety:  yes Other pain:  back pain Sleep hygiene:  Poor.  Takes melatonin and still sleeps 4 hours.  Wakes up and can't bet back to sleep.  "Mind is going" Family history of headache:  no She is a 11/04/2020   PAST MEDICAL HISTORY: No past medical history on file.  PAST SURGICAL HISTORY: No past surgical history on file.  MEDICATIONS: Current Outpatient Medications on File Prior to Visit  Medication Sig Dispense Refill   acetaminophen (TYLENOL) 325 MG tablet Take 2 tablets (  650 mg total) by mouth every 6 (six) hours as needed for up to 30 doses. 30 tablet 0   diphenhydrAMINE (BENADRYL) 25 MG tablet Take 1 tablet (25 mg total) by mouth every 8 (eight) hours as needed for up to 21 doses. 21 tablet 0   ELDERBERRY PO Take 1 tablet by mouth daily.     ibuprofen (ADVIL)  200 MG tablet Take 800 mg by mouth every 6 (six) hours as needed for fever, headache or mild pain.     ibuprofen (ADVIL) 600 MG tablet Take 1 tablet (600 mg total) by mouth every 6 (six) hours as needed for up to 30 doses for mild pain or moderate pain. 30 tablet 0   prochlorperazine (COMPAZINE) 10 MG tablet Take 1 tablet (10 mg total) by mouth every 8 (eight) hours as needed for up to 12 doses for nausea or vomiting (headache). 12 tablet 0   No current facility-administered medications on file prior to visit.    ALLERGIES: Allergies  Allergen Reactions   Amoxicillin Swelling    FAMILY HISTORY: No family history on file.  Objective:  Blood pressure 125/74, pulse 95, height 5\' 2"  (1.575 m), weight 216 lb 6.4 oz (98.2 kg), SpO2 98 %. General: No acute distress.  Patient appears well-groomed.   Head:  Normocephalic/atraumatic Eyes:  fundi examined but not visualized Neck: supple, no paraspinal tenderness, full range of motion Back: No paraspinal tenderness Heart: regular rate and rhythm Lungs: Clear to auscultation bilaterally. Vascular: No carotid bruits. Neurological Exam: Mental status: alert and oriented to person, place, and time, recent and remote memory intact, fund of knowledge intact, attention and concentration intact, speech fluent and not dysarthric, language intact. Cranial nerves: CN I: not tested CN II: pupils equal, round and reactive to light, visual fields intact CN III, IV, VI:  full range of motion, no nystagmus, no ptosis CN V: facial sensation intact. CN VII: upper and lower face symmetric CN VIII: hearing intact CN IX, X: gag intact, uvula midline CN XI: sternocleidomastoid and trapezius muscles intact CN XII: tongue midline Bulk & Tone: normal, no fasciculations. Motor:  muscle strength 5/5 throughout Sensation:  Pinprick, temperature and vibratory sensation intact. Deep Tendon Reflexes:  2+ throughout,  toes downgoing.   Finger to nose testing:  Without  dysmetria.   Heel to shin:  Without dysmetria.   Gait:  Normal station and stride.  Romberg negative.    Thank you for allowing me to take part in the care of this patient.  , DO  CC: Shon Millet, MD

## 2020-12-16 ENCOUNTER — Ambulatory Visit (INDEPENDENT_AMBULATORY_CARE_PROVIDER_SITE_OTHER): Payer: Self-pay | Admitting: Neurology

## 2020-12-16 ENCOUNTER — Other Ambulatory Visit: Payer: Self-pay

## 2020-12-16 ENCOUNTER — Encounter: Payer: Self-pay | Admitting: Neurology

## 2020-12-16 VITALS — BP 125/74 | HR 95 | Ht 62.0 in | Wt 216.4 lb

## 2020-12-16 DIAGNOSIS — G43111 Migraine with aura, intractable, with status migrainosus: Secondary | ICD-10-CM

## 2020-12-16 MED ORDER — PROPRANOLOL HCL 40 MG PO TABS: 40.0000 mg | ORAL_TABLET | Freq: Two times a day (BID) | ORAL | 5 refills | Status: AC

## 2020-12-16 NOTE — Patient Instructions (Signed)
  Start propranolol 40mg  twice daily.  Caution for lightheadedness.  Contact in 6 weeks with update and we can increase dose if needed. Take Nurtec at earliest onset of headache.  Maximum 1 tablet in 24 hours.  Limit use of pain relievers to no more than 2 days out of the week.  These medications include acetaminophen, NSAIDs (ibuprofen/Advil/Motrin, naproxen/Aleve, triptans (Imitrex/sumatriptan), Excedrin, and narcotics.  This will help reduce risk of rebound headaches. Be aware of common food triggers:  - Caffeine:  coffee, black tea, cola, Mt. Dew  - Chocolate  - Dairy:  aged cheeses (brie, blue, cheddar, gouda, [place], provolone, [place], Swiss, etc), chocolate milk, buttermilk, sour cream, limit eggs and yogurt  - Nuts, peanut butter  - Alcohol  - Cereals/grains:  FRESH breads (fresh bagels, sourdough, doughnuts), yeast productions  - Processed/canned/aged/cured meats (pre-packaged deli meats, hotdogs)  - MSG/glutamate:  soy sauce, flavor enhancer, pickled/preserved/marinated foods  - Sweeteners:  aspartame (Equal, Nutrasweet).  Sugar and Splenda are okay  - Vegetables:  legumes (lima beans, lentils, snow peas, fava beans, pinto peans, peas, garbanzo beans), sauerkraut, onions, olives, pickles  - Fruit:  avocados, bananas, citrus fruit (orange, lemon, grapefruit), mango  - Other:  Frozen meals, macaroni and cheese Routine exercise Stay adequately hydrated (aim for 64 oz water daily) Keep headache diary Maintain proper stress management Maintain proper sleep hygiene Do not skip meals Consider supplements:  magnesium citrate 400mg  daily, riboflavin 400mg  daily, coenzyme Q10 100mg  three times daily. Plan is to get an MRI of brain.  Let [place] know when you have coverage.

## 2021-04-02 ENCOUNTER — Encounter: Payer: Self-pay | Admitting: Neurology

## 2021-05-15 ENCOUNTER — Other Ambulatory Visit: Payer: Self-pay

## 2021-05-15 ENCOUNTER — Ambulatory Visit
Admission: EM | Admit: 2021-05-15 | Discharge: 2021-05-15 | Disposition: A | Discharge status: Home / Self Care | Payer: 59

## 2021-05-15 DIAGNOSIS — R200 Anesthesia of skin: Secondary | ICD-10-CM

## 2021-05-15 NOTE — ED Provider Notes (Signed)
Patient presents to urgent care with a chief complaint of numbness on the left side of her body 3 days ago.  Patient states she is fallen several times since this began.  Patient states has had this in the past, does not recall last episode but was seen by neurology for this issue in September of 2022.  At that time, she did not have health insurance and was unable to afford the MRI of the brain that he recommended.  Patient was advised that she definitely needs to follow-up with neurology and that there is very little I can do for her here in the urgent care setting today.  I also advised her that I am unable to order the MRI for her.  I advised patient to reach out to her neurologist's clinical staff to request the MRI now that she has insurance through her husband's policy.   Theadora Rama Scales, PA-C 05/15/21 1715

## 2021-05-15 NOTE — ED Triage Notes (Signed)
Pt reports having numbness to left side of her body that started 3 days ago, she also reports having a fall yesterday at work. Pt states she has had this happen before and she sees a neurologist (appt in April). Pt reports at times she has a headache and blurry vision.

## 2021-05-16 ENCOUNTER — Telehealth: Payer: Self-pay | Admitting: Neurology

## 2021-05-16 DIAGNOSIS — G43111 Migraine with aura, intractable, with status migrainosus: Secondary | ICD-10-CM

## 2021-05-16 NOTE — Telephone Encounter (Signed)
Pt called in and stated Dr. Everlena Cooper had wanted her to do an MRI in the past, but she didn't have insurance. She now has it and would like to have one done.

## 2021-05-31 ENCOUNTER — Other Ambulatory Visit: Payer: Self-pay

## 2021-05-31 ENCOUNTER — Ambulatory Visit
Admission: RE | Admit: 2021-05-31 | Discharge: 2021-05-31 | Disposition: A | Discharge status: Home / Self Care | Payer: 59 | Source: Ambulatory Visit | Attending: Neurology | Admitting: Neurology

## 2021-05-31 DIAGNOSIS — G43111 Migraine with aura, intractable, with status migrainosus: Secondary | ICD-10-CM

## 2021-05-31 IMAGING — MR MR HEAD WO/W CM
13 series · 48 of 48 positions shown · IV contrast (multihance)
Comparison: Head CT [DATE]

CLINICAL DATA: Headache, chronic, new features or increased
frequency. Migraine with EBADAT. Left-sided numbness.

EXAM:
MRI HEAD WITHOUT AND WITH CONTRAST
TECHNIQUE: Multiplanar, multiecho pulse sequences of the brain and surrounding
structures were obtained without and with intravenous contrast.
CONTRAST:  20mL MULTIHANCE GADOBENATE DIMEGLUMINE 529 MG/ML IV SOLN

[Series 2: T1 · sagittal · 5.0mm · 0.45mm/px · 2 of 21 slices shown]
[im 1/21]
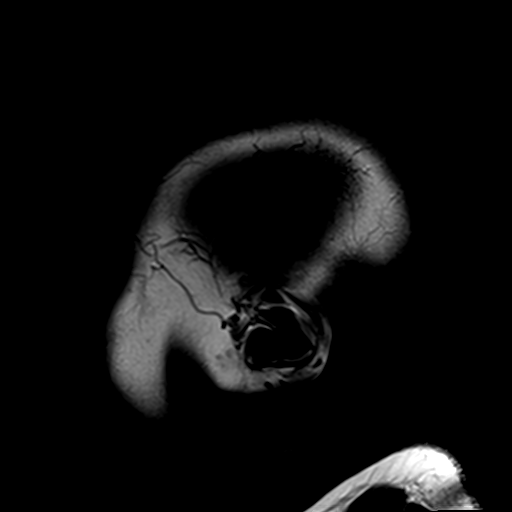
[im 21/21]
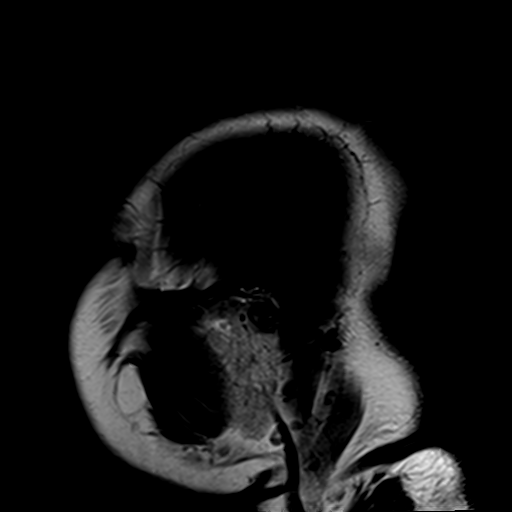

[Series 3: DWI · axial · 3.0mm · 1.80mm/px · z∈[-45,+100]mm · 8 of 100 slices shown (1 of 4)]
[im 1/100]
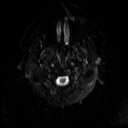
[im 15/100]
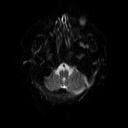
[im 29/100]
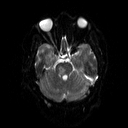
[im 43/100]
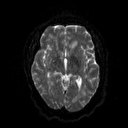
[im 57/100]
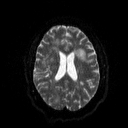
[im 71/100]
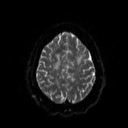
[im 85/100]
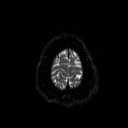
[im 100/100]
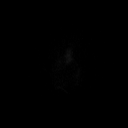

[Series 4: DWI · axial · 3.0mm · 1.80mm/px · z∈[-45,+100]mm · 3 of 50 slices shown (2 of 4)]
[im 1/50]
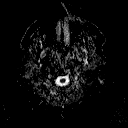
[im 25/50]
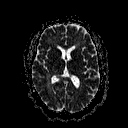
[im 50/50]
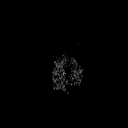

[Series 5: DWI · coronal · 5.0mm · 1.80mm/px · 4 of 66 slices shown (3 of 4)]
[im 1/66]
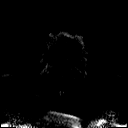
[im 22/66]
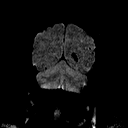
[im 44/66]
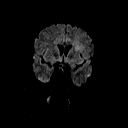
[im 66/66]
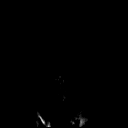

[Series 6: DWI · coronal · 5.0mm · 1.80mm/px · 2 of 34 slices shown (4 of 4)]
[im 1/34]
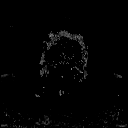
[im 34/34]
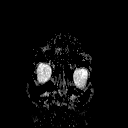

[Series 7: T2 · axial · 5.0mm · 0.60mm/px · 1 of 22 slices shown (1 of 2)]
[im 1/22]
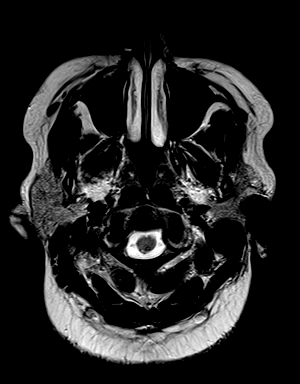

[Series 8: FLAIR · axial · 3.0mm · 0.45mm/px · z∈[-39,+94]mm · 2 of 30 slices shown]
[im 1/30]
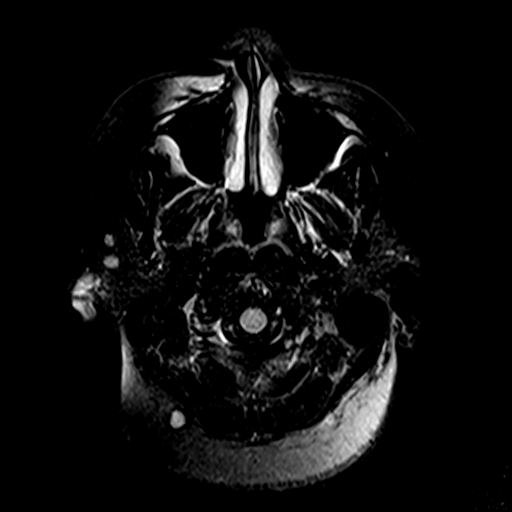
[im 30/30]
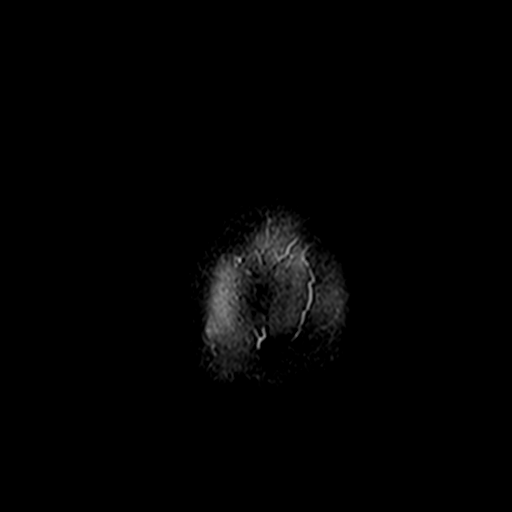

[Series 9: mip_images(sw) · axial · 32.0mm · 0.90mm/px · z∈[-28,+83]mm · 2 of 29 slices shown]
[im 1/29]
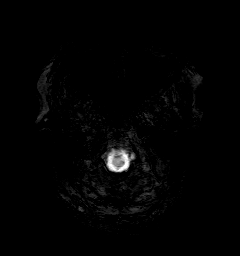
[im 29/29]
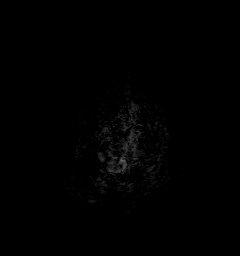

[Series 10: swi_images · axial · 4.0mm · 0.90mm/px · z∈[-42,+96]mm · 2 of 36 slices shown]
[im 1/36]
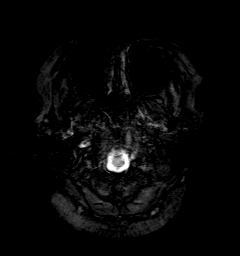
[im 36/36]
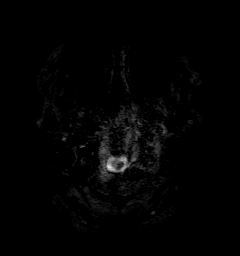

[Series 11: t1_mpr_tra · axial · 1.0mm · 0.75mm/px · z∈[-43,+99]mm · 9 of 144 slices shown]
[im 1/144]
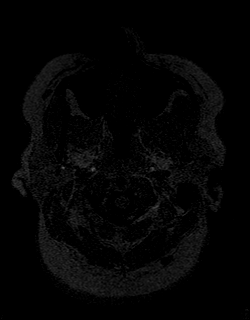
[im 18/144]
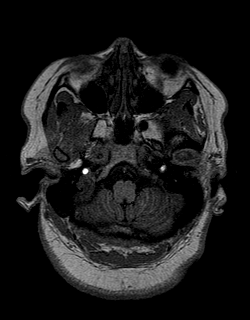
[im 36/144]
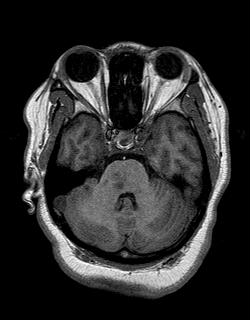
[im 54/144]
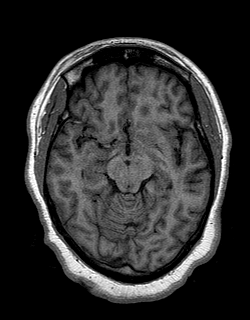
[im 72/144]
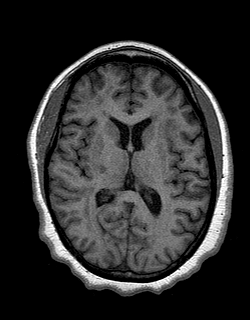
[im 90/144]
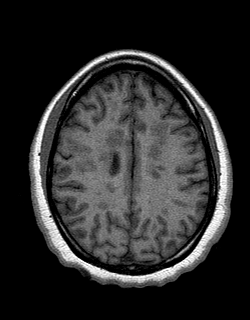
[im 108/144]
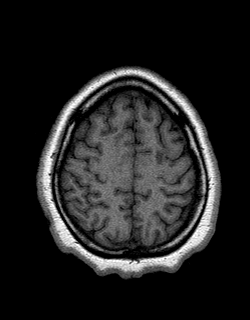
[im 126/144]
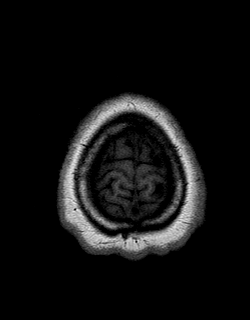
[im 144/144]
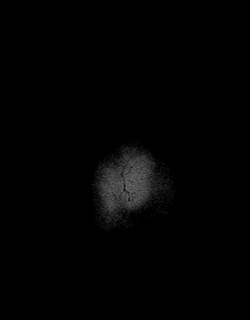

[Series 12: T2 · coronal · 5.0mm · 0.45mm/px · 2 of 27 slices shown (2 of 2)]
[im 1/27]
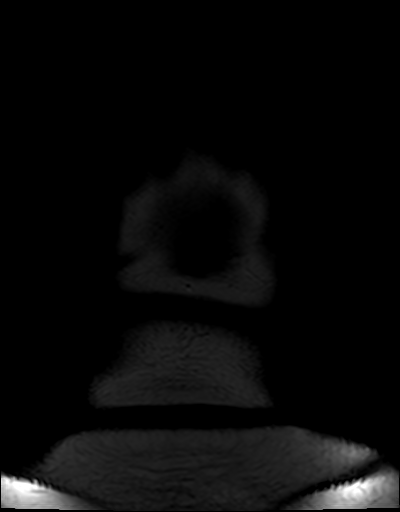
[im 27/27]
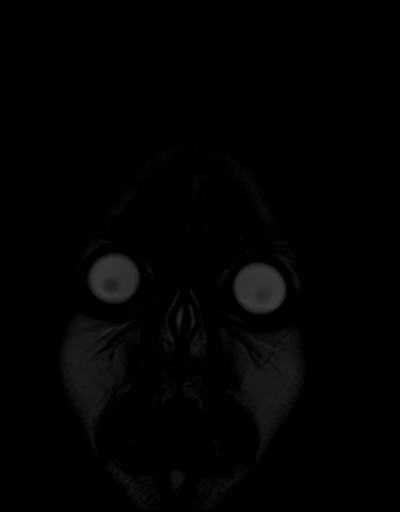

[Series 13: t1_mpr_tra post · axial · 1.0mm · 0.75mm/px · z∈[-43,+99]mm · 9 of 144 slices shown]
[im 1/144]
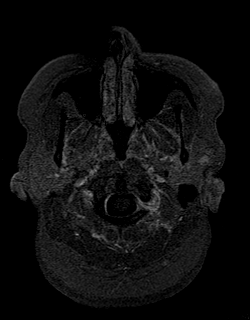
[im 18/144]
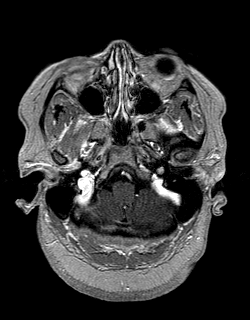
[im 36/144]
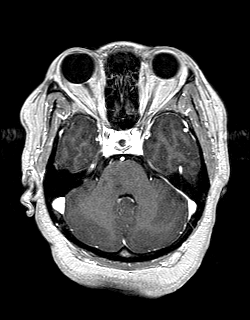
[im 54/144]
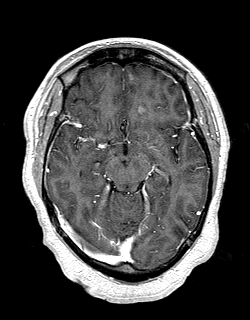
[im 72/144]
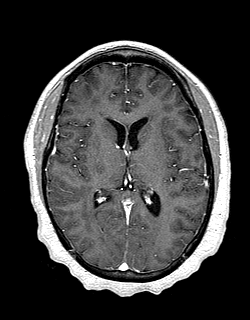
[im 90/144]
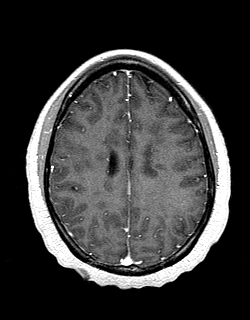
[im 108/144]
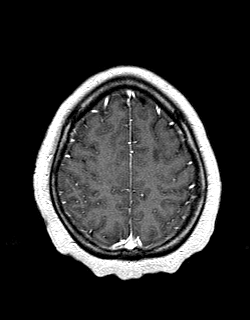
[im 126/144]
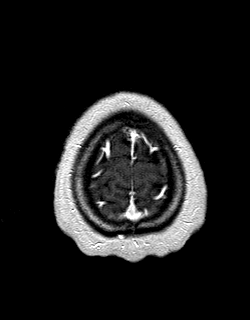
[im 144/144]
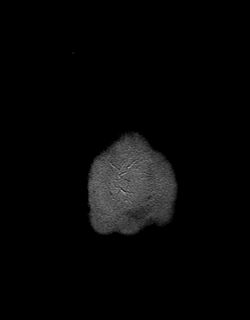

[Series 14: post cor · coronal · 5.0mm · 0.45mm/px · 2 of 27 slices shown]
[im 1/27]
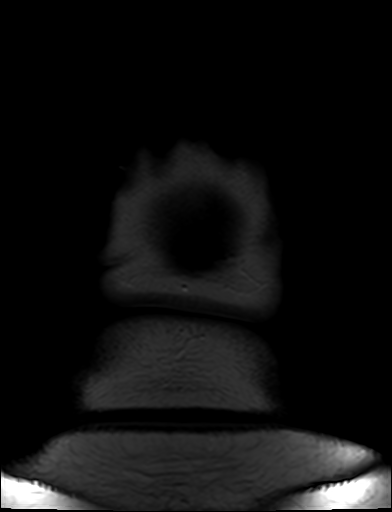
[im 27/27]
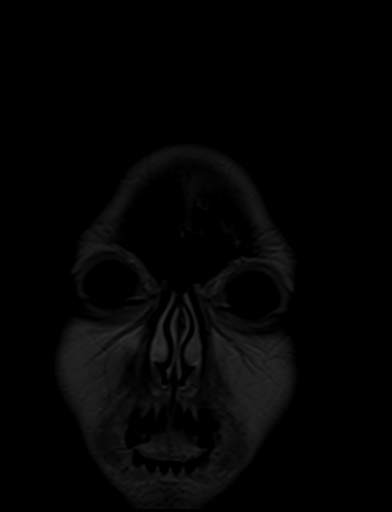

[48 of 48 positions shown; findings below may reference images not displayed]

FINDINGS: Brain: There is no evidence of an acute infarct, intracranial
hemorrhage, midline shift, or extra-axial fluid collection. The
ventricles and sulci are normal. There is a 6 mm T2 hyperintense,
nonenhancing cystic focus in the posterior aspect of the pituitary
gland.

Widespread, patchy and confluent T2 FLAIR hyperintensities are
present throughout the subcortical, deep, and periventricular
cerebral white matter bilaterally as well as in the right pons and
cerebellum. There is involvement of the corpus callosum. Some of the
lesions are oriented perpendicularly to the lateral ventricles.
Others appear more rounded and somewhat masslike or tumefactive,
including in the left frontal lobe and pons. There is enhancement
associated with lesions in the right pons, both frontal lobes, and
left parietal lobe (annotated on series 13). Many lesions
demonstrate T2 shine through on diffusion-weighted imaging, however
no truly restricted diffusion is evident. There is no mass effect.

Vascular: Major intracranial vascular flow voids are preserved.

Skull and upper cervical spine: Unremarkable bone marrow signal.

Sinuses/Orbits: Unremarkable orbits. Paranasal sinuses and mastoid
air cells are clear.

Other: Subcentimeter nodules in both parotid glands, favor
intraparotid lymph nodes.
IMPRESSION: 1. Extensive white matter disease most concerning for multiple
sclerosis, with some lesions enhancing suggesting active
demyelination. Alternative considerations might include [REDACTED]sculitis, and infection such as Lyme disease.
2. 6 mm pituitary cyst.

## 2021-05-31 MED ORDER — GADOBENATE DIMEGLUMINE 529 MG/ML IV SOLN
20.0000 mL | Freq: Once | INTRAVENOUS | Status: AC | PRN
  Administered 2021-05-31: 20 mL via INTRAVENOUS

## 2021-06-02 ENCOUNTER — Telehealth: Payer: Self-pay

## 2021-06-02 ENCOUNTER — Other Ambulatory Visit: Payer: Self-pay | Admitting: Pharmacy Technician

## 2021-06-02 ENCOUNTER — Telehealth: Payer: Self-pay | Admitting: Pharmacy Technician

## 2021-06-02 DIAGNOSIS — G35 Multiple sclerosis: Secondary | ICD-10-CM | POA: Insufficient documentation

## 2021-06-02 NOTE — Telephone Encounter (Signed)
-----   Message from Drema Dallas, DO sent at 06/02/2021  8:22 AM EDT ----- ?Discussed MRI results with patient.   ?1.  Set up for Solu-Medrol 1000mg  IV daily for 3 days.  Will initiate multiple sclerosis workup - MRI of cervical and thoracic spine with and without contrast.   ?2.  LF to check CSF labs - cell count, protein, glucose, gram stain/culture, cytology, flow cytometry, Lyme, ACE, oligoclonal bands, IgG index.   ?3  Check blood work - ANA, sed rate, CRP, SSA/SSB antibodies, ANCA panel, Lyme.   ?

## 2021-06-02 NOTE — Telephone Encounter (Signed)
Dr. Tomi Likens ?Fyi note: ?Auth Submission: no auth needed ?Payer: [employer] ?Medication & CPT/J Code(s) submitted: Solumedrol (Methylprednisolone) J2930 ?Route of submission (phone, fax, portal): PORTAL/PHONE ?Auth type: Buy/Bill ?Units/visits requested: 3 ?Reference number:  ?Approval from: 06/02/21 to 08/02/21  ? ?Patient will be scheduled as soon as possible. ?

## 2021-06-03 ENCOUNTER — Other Ambulatory Visit: Payer: Self-pay

## 2021-06-03 ENCOUNTER — Telehealth: Payer: Self-pay | Admitting: Neurology

## 2021-06-03 ENCOUNTER — Other Ambulatory Visit (INDEPENDENT_AMBULATORY_CARE_PROVIDER_SITE_OTHER): Payer: 59

## 2021-06-03 DIAGNOSIS — G35 Multiple sclerosis: Secondary | ICD-10-CM

## 2021-06-03 LAB — C-REACTIVE PROTEIN: CRP: <1 mg/dL (ref 0.5–20.0)

## 2021-06-03 LAB — SEDIMENTATION RATE: Sed Rate: 39 mm/hr — ABNORMAL HIGH (ref 0–20)

## 2021-06-03 NOTE — Telephone Encounter (Signed)
Patient called stating she had received a voicemail to call back.  She didn't know who called.  She stated shes having testing tomorrow and didn't know if it was information she needs before that testing. ?

## 2021-06-03 NOTE — Telephone Encounter (Signed)
We have everything figured out. Please keep LP schedule for 06/04/21, and the infusion schedule 3/16-17 and 3/20.  ?

## 2021-06-04 ENCOUNTER — Other Ambulatory Visit (HOSPITAL_COMMUNITY)
Admission: RE | Admit: 2021-06-04 | Discharge: 2021-06-04 | Disposition: A | Discharge status: Home / Self Care | Payer: 59 | Source: Ambulatory Visit | Attending: Neurology | Admitting: Neurology

## 2021-06-04 ENCOUNTER — Ambulatory Visit
Admission: RE | Admit: 2021-06-04 | Discharge: 2021-06-04 | Disposition: A | Discharge status: Home / Self Care | Payer: 59 | Source: Ambulatory Visit | Attending: Neurology | Admitting: Neurology

## 2021-06-04 DIAGNOSIS — G35 Multiple sclerosis: Secondary | ICD-10-CM | POA: Insufficient documentation

## 2021-06-04 IMAGING — XA DG SPINAL PUNCT LUMBAR DIAG WITH FL CT GUIDANCE
2 series · 2 of 2 positions shown · non-contrast
Comparison: None

CLINICAL DATA: 30-year-old woman with chronic headaches and
left-sided numbness was found to have extensive white matter disease
on MRI of the brain, suspicious for multiple sclerosis.

EXAM:
DIAGNOSTIC LUMBAR PUNCTURE UNDER FLUOROSCOPIC GUIDANCE

[Series 1: ortho adipose · 1 of 1 slices shown (1 of 2)]
[im 1/1]
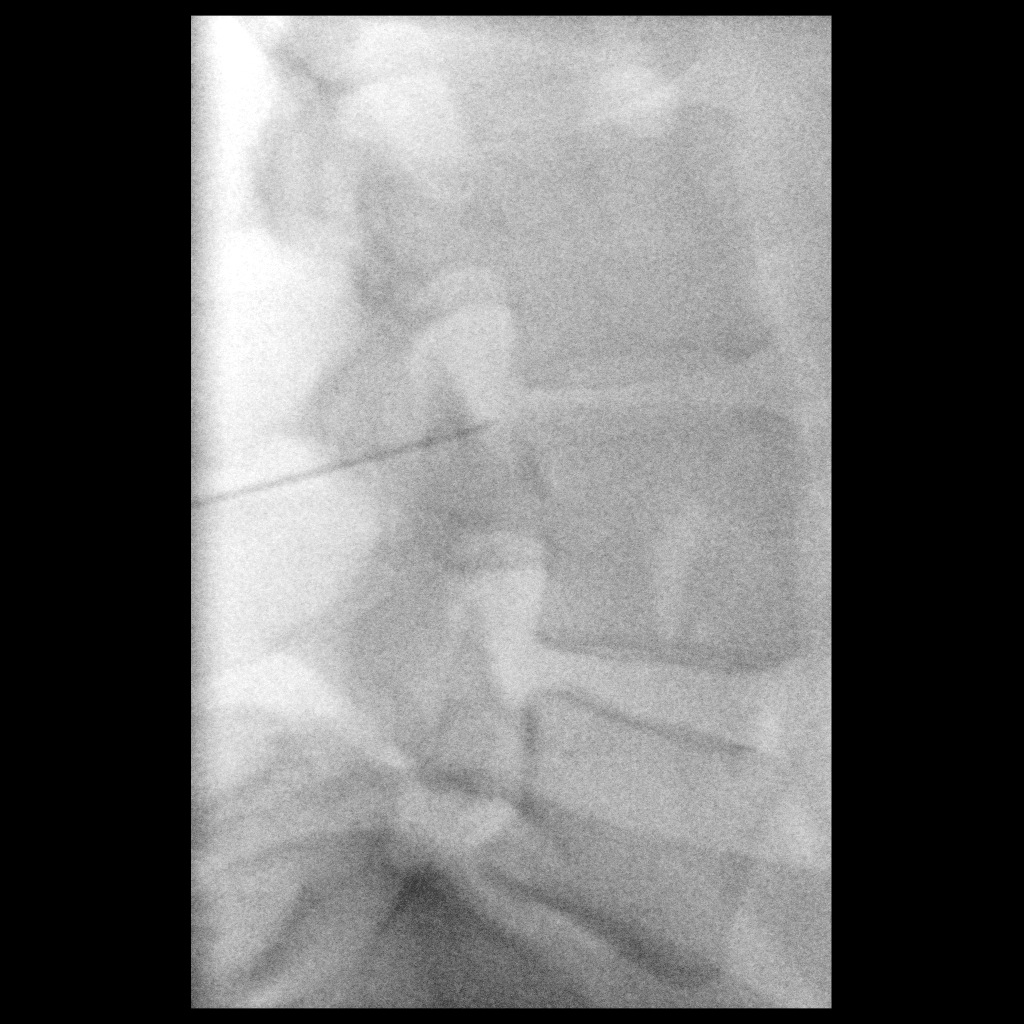

[Series 2: ortho adipose · 1 of 1 slices shown (2 of 2)]
[im 1/1]
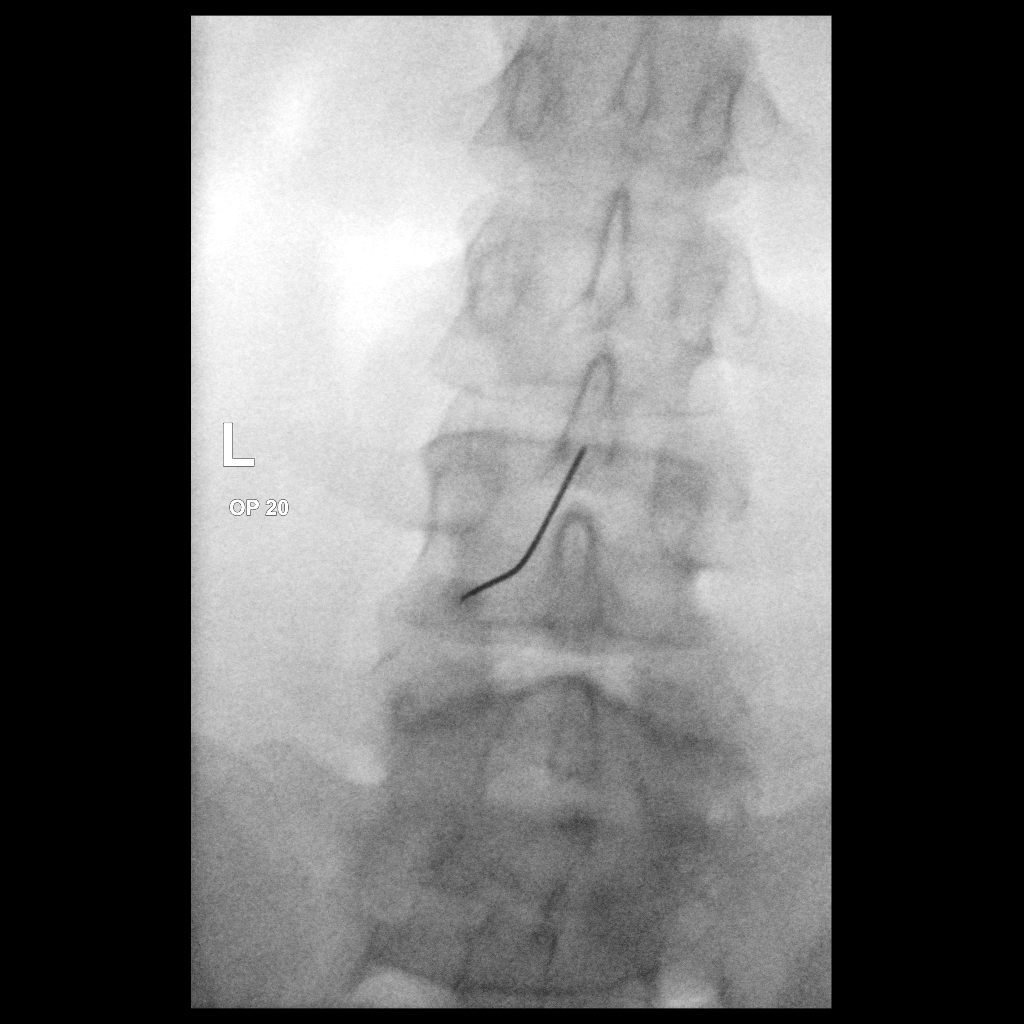

[2 of 2 positions shown; findings below may reference images not displayed]

FLUOROSCOPY:
Fluoroscopy Time:  1 minute 52 seconds

Radiation Exposure Index (if provided by the fluoroscopic device):
25 mGy

Number of Acquired Spot Images: 0

PROCEDURE:
Informed consent was obtained from the patient prior to the
procedure, including potential complications of headache, allergy,
and pain. With the patient prone, the lower back was prepped with
Betadine. 1% Lidocaine was used for local anesthesia. Lumbar
puncture was performed at the L3-L4 level using a 20 gauge needle
with return of clear CSF with an opening pressure of 20 cm water. 18
ml of CSF were obtained for laboratory studies. The patient
tolerated the procedure well and there were no apparent
complications.
IMPRESSION: Fluoroscopy guided diagnostic lumbar puncture as above.

## 2021-06-04 NOTE — Discharge Instructions (Signed)

## 2021-06-04 NOTE — Progress Notes (Signed)
1 vial of blood drawn from pts LAC to be sent off with LP lab work. 1 successful attempt, pt tolerated well. Gauze and tape applied after. 

## 2021-06-05 ENCOUNTER — Ambulatory Visit (INDEPENDENT_AMBULATORY_CARE_PROVIDER_SITE_OTHER): Payer: 59

## 2021-06-05 ENCOUNTER — Other Ambulatory Visit: Payer: Self-pay

## 2021-06-05 ENCOUNTER — Telehealth: Payer: Self-pay

## 2021-06-05 VITALS — BP 116/71 | HR 87 | Temp 98.7°F | Resp 18 | Ht 62.0 in | Wt 214.6 lb

## 2021-06-05 DIAGNOSIS — G35 Multiple sclerosis: Secondary | ICD-10-CM | POA: Diagnosis not present

## 2021-06-05 LAB — LYME DISEASE, WESTERN BLOT
IgG P18 Ab.: ABSENT
IgG P23 Ab.: ABSENT
IgG P28 Ab.: ABSENT
IgG P30 Ab.: ABSENT
IgG P39 Ab.: ABSENT
IgG P41 Ab.: ABSENT
IgG P45 Ab.: ABSENT
IgG P58 Ab.: ABSENT
IgG P66 Ab.: ABSENT
IgG P93 Ab.: ABSENT
IgM P23 Ab.: ABSENT
IgM P39 Ab.: ABSENT
IgM P41 Ab.: ABSENT
Lyme IgG Wb: NEGATIVE
Lyme IgM Wb: NEGATIVE

## 2021-06-05 LAB — CYTOLOGY - NON PAP

## 2021-06-05 MED ORDER — SODIUM CHLORIDE 0.9 % IV SOLN
1000.0000 mg | Freq: Once | INTRAVENOUS | Status: AC
  Administered 2021-06-05: 1000 mg via INTRAVENOUS
  Filled 2021-06-05: qty 16

## 2021-06-05 NOTE — Progress Notes (Signed)
Diagnosis: Multiple Sclerosis ? ?Provider:  Marshell Garfinkel, MD ? ?Procedure: Infusion ? ?IV Type: Peripheral, IV Location: L Antecubital ? ?Solumedrol (Methylprednisolone), Dose: 1000 mg ? ?Infusion Start Time: 11.39 ? ?Infusion Stop Time: 12.45 ? ?Post Infusion IV Care: Observation period completed ? ?Discharge: Condition: Good, Destination: Home . AVS provided to patient.  ? ?Performed by:  Paul Dykes, RN  ?  ?

## 2021-06-05 NOTE — Telephone Encounter (Signed)
VM left by [facility] urgent lab result received.  ? ?CSF abnormal.  ?

## 2021-06-06 ENCOUNTER — Ambulatory Visit (INDEPENDENT_AMBULATORY_CARE_PROVIDER_SITE_OTHER): Payer: 59

## 2021-06-06 VITALS — BP 103/63 | HR 98 | Temp 97.8°F | Resp 18 | Ht 62.0 in | Wt 214.4 lb

## 2021-06-06 DIAGNOSIS — G35 Multiple sclerosis: Secondary | ICD-10-CM | POA: Diagnosis not present

## 2021-06-06 MED ORDER — SODIUM CHLORIDE 0.9 % IV SOLN
1000.0000 mg | Freq: Once | INTRAVENOUS | Status: AC
  Administered 2021-06-06: 1000 mg via INTRAVENOUS
  Filled 2021-06-06: qty 16

## 2021-06-06 NOTE — Progress Notes (Signed)
Diagnosis: Myasthenia Gravis ? ?Provider:  Marshell Garfinkel, MD ? ?Procedure: Infusion ? ?IV Type: Peripheral, IV Location: L Antecubital ? ?Solumedrol (Methylprednisolone), Dose: 1000 mg ? ?Infusion Start Time: [id number] ? ?Infusion Stop Time: [id number] ? ?Post Infusion IV Care: Observation period completed and Peripheral IV Discontinued ? ?Discharge: Condition: Good, Destination: Home . AVS provided to patient.  ? ?Performed by:  Koren Shiver, RN  ?  ?

## 2021-06-09 ENCOUNTER — Other Ambulatory Visit: Payer: Self-pay

## 2021-06-09 ENCOUNTER — Ambulatory Visit (INDEPENDENT_AMBULATORY_CARE_PROVIDER_SITE_OTHER): Payer: 59

## 2021-06-09 VITALS — BP 119/81 | HR 79 | Temp 98.5°F | Resp 20 | Ht 62.0 in | Wt 216.2 lb

## 2021-06-09 DIAGNOSIS — G35 Multiple sclerosis: Secondary | ICD-10-CM

## 2021-06-09 DIAGNOSIS — Z0279 Encounter for issue of other medical certificate: Secondary | ICD-10-CM

## 2021-06-09 LAB — ANCA SCREEN W REFLEX TITER: ANCA SCREEN: NEGATIVE

## 2021-06-09 LAB — SJOGREN'S SYNDROME ANTIBODS(SSA + SSB)
SSA (Ro) (ENA) Antibody, IgG: <1 AI
SSB (La) (ENA) Antibody, IgG: <1 AI

## 2021-06-09 MED ORDER — SODIUM CHLORIDE 0.9 % IV SOLN
1000.0000 mg | Freq: Once | INTRAVENOUS | Status: AC
  Administered 2021-06-09: 1000 mg via INTRAVENOUS
  Filled 2021-06-09: qty 16

## 2021-06-09 NOTE — Progress Notes (Signed)
Diagnosis: Multiple Sclerosis ? ?Provider:  Marshell Garfinkel, MD ? ?Procedure: Infusion ? ?IV Type: Peripheral, IV Location: L Antecubital ? ?Solumedrol (Methylprednisolone), Dose: 1000 mg ? ?Infusion Start Time: 1010 ? ?Infusion Stop Time: 1110 ? ?Post Infusion IV Care: Peripheral IV Discontinued ? ?Discharge: Condition: Good, Destination: Home . AVS provided to patient.  ? ?Performed by:  Koren Shiver, RN  ?  ?

## 2021-06-10 NOTE — Progress Notes (Signed)
Pt advised of her lab results.  ?Per pt since her infusion every thing not tasting right her sleep pattern has changed.

## 2021-06-12 LAB — LEUKEMIA/LYMPHOMA EVALUATION PANEL

## 2021-06-12 LAB — CNS IGG SYNTHESIS RATE, CSF+BLOOD
Albumin Serum: 4.7 g/dL (ref 3.6–5.1)
Albumin, CSF: 16.4 mg/dL (ref 8.0–42.0)
CNS-IgG Synthesis Rate: 5.6 mg/24 h — ABNORMAL HIGH (ref <=3.3)
IgG (Immunoglobin G), Serum: 1270 mg/dL (ref 600–1640)
IgG Total CSF: 4.1 mg/dL (ref 0.8–7.7)
IgG-Index: 0.93 — ABNORMAL HIGH (ref <0.70)

## 2021-06-12 LAB — CSF CELL COUNT WITH DIFFERENTIAL
RBC Count, CSF: 22 cells/uL — ABNORMAL HIGH
WBC, CSF: 4 cells/uL (ref 0–5)

## 2021-06-12 LAB — CSF CULTURE W GRAM STAIN
MICRO NUMBER:: 13134132
Result:: NO GROWTH
SPECIMEN QUALITY:: ADEQUATE

## 2021-06-12 LAB — OLIGOCLONAL BANDS, CSF + SERM

## 2021-06-12 LAB — LYME DISEASE ABS IGG, IGM, IFA, CSF
Lyme Disease AB (IgG), IBL: NOT DETECTED
Lyme Disease AB (IgM), IBL: NOT DETECTED

## 2021-06-12 LAB — PROTEIN, CSF: Total Protein, CSF: 32 mg/dL (ref 15–45)

## 2021-06-12 LAB — ANGIOTENSIN CONVERTING ENZYME, CSF: ANGIOTENSIN CONVERTING ENZYME ( ACE) CSF: 6 U/L (ref <=15)

## 2021-06-12 LAB — GLUCOSE, CSF: Glucose, CSF: 56 mg/dL (ref 40–80)

## 2021-06-17 ENCOUNTER — Telehealth: Payer: Self-pay | Admitting: Neurology

## 2021-06-17 NOTE — Telephone Encounter (Signed)
Per pt she has numbness down her left side and fallen down the stairs since her last visit. ?Patient seen last September and the ED in Feburary. ? ?Pt advise Dr.Jaffe will go over what is excepted for the forms. ?

## 2021-06-17 NOTE — Telephone Encounter (Signed)
Patient called and stated she was following up on her paperwork for work.  She stated its been 2 weeks and she needs it asap. ?

## 2021-06-22 NOTE — Progress Notes (Signed)
? ?NEUROLOGY FOLLOW UP OFFICE NOTE ? ?Lindsey Schmitt ?[id number] ? ?Assessment/Plan:  ? ?Relapsing remitting multiple sclerosis ?Migraine without aura, without status migrainosus, not intractable.  As her hemisensory loss was due to MS rather than migraine aura, she may try triptans.  If triptans ineffective, will prescribe Nurtec. ?  ?Plan to start Kesimpta ?Check CBC w/diff, CMP, D, Hep B panel, quantitative immunoglobulin panel ?Repeat CBC w/diff, CMP, D and quantitative immunoglobulin panel in 6 months. ?For left leg pain, start gabapentin 100mg  three times daily.  We can increase dose if needed. ?Rizatriptan 10mg  for migraine rescue ?Will fill out FMLA ?Follow up 6 months. ?  ?  ?  ?Subjective:  ?Lindsey Schmitt is a 31 year old female with PCOS who follows up for newly-diagnosed MS and migraines. ?  ?UPDATE: ?Due to financial constraints and lack of insurance at the time, she wasn't able to get an MRI until last month.  MRI of brain on 05/31/2021 personally reviewed showed extensive white matter hyperintensities concerning for MS, some enhancing.  Incidental 6 mm pituitary cyst also noted.  Although improved, still noted numbness.  She underwent 3 day course of Solu-Medrol.  She underwent lumbar puncture on 06/04/2021 for CSF analysis which demonstrated cell count 4, protein 32, glucose 56, over 5 oligoclonal bands (not seen in serum), elevated IgG index 0.93, negative cytology, ACE 6, Lyme IgG/IgM ab negative, negative culture. ? ?She continued to have left sided numbness with falls for a couple of months.  Overall improved.  However, she struggles with pain and numbness in the left lower extremity with cramping in the calf.  Makes it difficult to ambulate.  If she drives, rarely her left hand may cramp up holding onto the steering wheel.   ? ?Discontinued propranolol because it was ineffective and didn't make her feel good.  Lately no bad migraines anyway.  Last migraine was in February.  Lasts  40 minutes with Nurtec. ? ? ?Current NSAIDS/analgesics:  ibuprofen ?Current triptans:  none ?Current ergotamine:  none ?Current anti-emetic:  none ?Current muscle relaxants:  none ?Current Antihypertensive medications:  none ?Current Antidepressant medications:  none ?Current Anticonvulsant medications:  none ?Current anti-CGRP:  Nurtec (rescue) ?Current Vitamins/Herbal/Supplements:  none ?Current Antihistamines/Decongestants:  none ?Other therapy:  none ?Hormone/birth control:  none ? ?Caffeine:  2 cups iced coffee daily every other day ?Diet:  Hydrates.  No soda. ?Exercise:  no ?Depression:  no; Anxiety:  yes ?Other pain:  back pain ?Sleep hygiene:  Poor.  Takes melatonin and still sleeps 4 hours.  Wakes up and can't bet back to sleep.  "Mind is going" ? ?HISTORY: ?She has had migraines since age 79.  It is typically a severe diffuse pressure headache with nausea, photophobia, phonophobia, blurred vision and aggravated by movement.  Typically lasts 3 hours with ibuprofen and occurs 3 times a month.   ?  ?In late June, she began experiencing numbness on the left side of her head, face and down to the shoulder.  No pain or weakness.  After a couple of weeks, on 09/22/2020, she developed one of her migraine headaches.  She said she also passed out.  She went to the ED where CT head personally reviewed was unremarkable.  She was diagnosed with migraine and treated with a headache cocktail.  However, symptoms lasted for another week.  She had another episode on 11/02/2020 but this time she developed total right hemisensory loss from head to toe.  No weakness.  She developed  severe headache.  The numbness lasted for an entire month before resolving.  She does report increased stress planning for her wedding.   ? ?  ?  ?Past NSAIDS/analgesics:  Tylenol ?Past abortive triptans:  none ?Past abortive ergotamine:  none ?Past muscle relaxants:  none ?Past anti-emetic:  none ?Past antihypertensive medications:  none ?Past  antidepressant medications:  none ?Past anticonvulsant medications:  none ?Past anti-CGRP:  none ?Past vitamins/Herbal/Supplements:  none ?Past antihistamines/decongestants:  none ?Other past therapies:  none ?  ? ?Family history of headache:  no ?She is a Investment banker, operational ? ?PAST MEDICAL HISTORY: ?Past Medical History:  ?Diagnosis Date  ? PCOS (polycystic ovarian syndrome)   ? ? ?MEDICATIONS: ?Current Outpatient Medications on File Prior to Visit  ?Medication Sig Dispense Refill  ? acetaminophen (TYLENOL) 325 MG tablet Take 2 tablets (650 mg total) by mouth every 6 (six) hours as needed for up to 30 doses. 30 tablet 0  ? diphenhydrAMINE (BENADRYL) 25 MG tablet Take 1 tablet (25 mg total) by mouth every 8 (eight) hours as needed for up to 21 doses. 21 tablet 0  ? ELDERBERRY PO Take 1 tablet by mouth daily.    ? ibuprofen (ADVIL) 200 MG tablet Take 800 mg by mouth every 6 (six) hours as needed for fever, headache or mild pain.    ? ibuprofen (ADVIL) 600 MG tablet Take 1 tablet (600 mg total) by mouth every 6 (six) hours as needed for up to 30 doses for mild pain or moderate pain. 30 tablet 0  ? prochlorperazine (COMPAZINE) 10 MG tablet Take 1 tablet (10 mg total) by mouth every 8 (eight) hours as needed for up to 12 doses for nausea or vomiting (headache). 12 tablet 0  ? propranolol (INDERAL) 40 MG tablet Take 1 tablet (40 mg total) by mouth 2 (two) times daily. 60 tablet 5  ? ?No current facility-administered medications on file prior to visit.  ? ? ?ALLERGIES: ?Allergies  ?Allergen Reactions  ? Amoxicillin Swelling  ? ? ?FAMILY HISTORY: ?Family History  ?Problem Relation Age of Onset  ? Multiple sclerosis Cousin   ? ? ?  ?Objective:  ?Blood pressure 137/85, pulse 98, height 5\' 2"  (1.575 m), weight 216 lb 12.8 oz (98.3 kg), SpO2 98 %. ?General: No acute distress.  Patient appears well-groomed.   ?Head:  Normocephalic/atraumatic ?Eyes:  Fundi examined but not visualized ?Neck: supple, no paraspinal tenderness, full range of  motion ?Heart:  Regular rate and rhythm ?Lungs:  Clear to auscultation bilaterally ?Back: No paraspinal tenderness ?Neurological Exam: alert and oriented to person, place, and time.  Speech fluent and not dysarthric, language intact.  CN II-XII intact. Bulk and tone normal, muscle strength 5-/5 left knee extension, otherwise 5/5 throughout.  Sensation to pinprick reduced in left foot up to shin.  Vibratory sensation reduced in left foot reduced.  Deep tendon reflexes 2+ throughout, toes downgoing.  Finger to nose testing intact.  Antalgic gait.  Romberg negative ? ? ? , DO ? ? ? ? ? ? ? ?

## 2021-06-23 ENCOUNTER — Ambulatory Visit (INDEPENDENT_AMBULATORY_CARE_PROVIDER_SITE_OTHER): Payer: 59 | Admitting: Neurology

## 2021-06-23 VITALS — BP 137/85 | HR 98 | Ht 62.0 in | Wt 216.8 lb

## 2021-06-23 DIAGNOSIS — G43009 Migraine without aura, not intractable, without status migrainosus: Secondary | ICD-10-CM | POA: Diagnosis not present

## 2021-06-23 DIAGNOSIS — G35 Multiple sclerosis: Secondary | ICD-10-CM

## 2021-06-23 MED ORDER — GABAPENTIN 100 MG PO CAPS: 100.0000 mg | ORAL_CAPSULE | Freq: Three times a day (TID) | ORAL | 5 refills | Status: AC

## 2021-06-23 MED ORDER — RIZATRIPTAN BENZOATE 10 MG PO TABS: 10.0000 mg | ORAL_TABLET | ORAL | 5 refills | Status: AC | PRN

## 2021-06-23 NOTE — Patient Instructions (Addendum)
Plan to start Kesimpta - take 1 injection weekly for first 3 weeks.  Skip week 4.  Then take week 5 and every 4 weeks thereafter. ?Check CBC with diff, CMP, quantitative immunoglobulin panel, vit D, Hepatitis B panel ?Repeat CBC with diff, CMP, quantitative immunoglobulin panel and vit D in 6 months ?For nerve pain, start gabapentin 100mg  three times daily.  We can increase dose if needed. ?At earliest onset of migraine, take rizatriptan 10mg .  May repeat after 2 hours.  Maximum 2 tablets in 24 hours. ?Follow up in 6 months (about a week after repeat labs) ?

## 2021-06-24 ENCOUNTER — Encounter: Payer: Self-pay | Admitting: Neurology

## 2021-06-24 ENCOUNTER — Other Ambulatory Visit (INDEPENDENT_AMBULATORY_CARE_PROVIDER_SITE_OTHER): Payer: 59

## 2021-06-24 DIAGNOSIS — G35 Multiple sclerosis: Secondary | ICD-10-CM

## 2021-06-25 ENCOUNTER — Other Ambulatory Visit: Payer: Self-pay | Admitting: Neurology

## 2021-06-25 ENCOUNTER — Telehealth: Payer: Self-pay

## 2021-06-25 LAB — CBC WITH DIFFERENTIAL
Basophils Absolute: 0.1 10*3/uL (ref 0.0–0.2)
Basos: 1 %
EOS (ABSOLUTE): 0.1 10*3/uL (ref 0.0–0.4)
Eos: 1 %
Hematocrit: 33.7 % — ABNORMAL LOW (ref 34.0–46.6)
Hemoglobin: 10.8 g/dL — ABNORMAL LOW (ref 11.1–15.9)
Immature Grans (Abs): 0 10*3/uL (ref 0.0–0.1)
Immature Granulocytes: 0 %
Lymphocytes Absolute: 1.8 10*3/uL (ref 0.7–3.1)
Lymphs: 26 %
MCH: 27.1 pg (ref 26.6–33.0)
MCHC: 32 g/dL (ref 31.5–35.7)
MCV: 85 fL (ref 79–97)
Monocytes Absolute: 0.4 10*3/uL (ref 0.1–0.9)
Monocytes: 6 %
Neutrophils Absolute: 4.7 10*3/uL (ref 1.4–7.0)
Neutrophils: 66 %
RBC: 3.99 x10E6/uL (ref 3.77–5.28)
RDW: 12.8 % (ref 11.7–15.4)
WBC: 7.2 10*3/uL (ref 3.4–10.8)

## 2021-06-25 LAB — COMPREHENSIVE METABOLIC PANEL
ALT: 24 U/L (ref 0–35)
AST: 19 U/L (ref 0–37)
Albumin: 4.7 g/dL (ref 3.5–5.2)
Alkaline Phosphatase: 52 U/L (ref 39–117)
BUN: 12 mg/dL (ref 6–23)
CO2: 27 mEq/L (ref 19–32)
Calcium: 9.9 mg/dL (ref 8.4–10.5)
Chloride: 105 mEq/L (ref 96–112)
Creatinine, Ser: 0.7 mg/dL (ref 0.40–1.20)
GFR: 115.65 mL/min (ref >60.00)
Glucose, Bld: 77 mg/dL (ref 70–99)
Potassium: 3.7 mEq/L (ref 3.5–5.1)
Sodium: 140 mEq/L (ref 135–145)
Total Bilirubin: 0.5 mg/dL (ref 0.2–1.2)
Total Protein: 7.2 g/dL (ref 6.0–8.3)

## 2021-06-25 LAB — VITAMIN D 25 HYDROXY (VIT D DEFICIENCY, FRACTURES): VITD: 14.1 ng/mL — ABNORMAL LOW (ref 30.00–100.00)

## 2021-06-25 MED ORDER — VITAMIN D3 1.25 MG (50000 UT) PO TABS: 50000.0000 [IU] | ORAL_TABLET | ORAL | 5 refills | Status: AC

## 2021-06-25 NOTE — Telephone Encounter (Signed)
-----   Message from Drema Dallas, DO sent at 06/25/2021  4:21 PM EDT ----- ?Labs are back.  OK to start Kesimpta.  Vit D level is low.  I would like to start supplementation.  I sent a script for D3 50,000 units once a week to [facility] [address] ?

## 2021-06-25 NOTE — Telephone Encounter (Signed)
Labs are back.  OK to start Kesimpta.  Vit D level is low.  I would like to start supplementation.  I sent a script for D3 50,000 units once a week to [facility] [address].  ? ?Pt called back no answer left a voice mail  advising to come by the office to pick up samples tomorrow  ?

## 2021-06-26 LAB — ACUTE HEP PANEL AND HEP B SURFACE AB
HEPATITIS C ANTIBODY REFILL$(REFL): NONREACTIVE
Hep A IgM: NONREACTIVE
Hep B C IgM: NONREACTIVE
Hepatitis B Surface Ag: NONREACTIVE
SIGNAL TO CUT-OFF: 0.08 (ref <1.00)

## 2021-06-26 LAB — REFLEX TIQ

## 2021-06-26 LAB — IGG, IGA, IGM
IgG (Immunoglobin G), Serum: 1189 mg/dL (ref 600–1640)
IgM, Serum: 83 mg/dL (ref 50–300)
Immunoglobulin A: 78 mg/dL (ref 47–310)

## 2021-06-26 NOTE — Progress Notes (Signed)
FMLA-Mother, Faxed off and copy for patient. ? ?FMLA-Patient ready for pick up. ? ?Patient aware will stop by today to pick up.  ?

## 2021-06-27 ENCOUNTER — Ambulatory Visit
Admission: RE | Admit: 2021-06-27 | Discharge: 2021-06-27 | Disposition: A | Discharge status: Home / Self Care | Payer: 59 | Source: Ambulatory Visit | Attending: Neurology | Admitting: Neurology

## 2021-06-27 DIAGNOSIS — G35 Multiple sclerosis: Secondary | ICD-10-CM

## 2021-06-27 IMAGING — MR MR CERVICAL SPINE WO/W CM
5 of 8 series · 26 of 48 positions shown · IV contrast (multihance)
Comparison: Brain MRI [DATE].

CLINICAL DATA: Provided history: Multiple sclerosis.

EXAM:
MRI CERVICAL SPINE WITHOUT AND WITH CONTRAST
TECHNIQUE: Multiplanar and multiecho pulse sequences of the cervical spine, to
include the craniocervical junction and cervicothoracic junction,
were obtained without and with intravenous contrast.
CONTRAST:  20mL MULTIHANCE GADOBENATE DIMEGLUMINE 529 MG/ML IV SOLN

[Series 5: T1 · sagittal · 3.0mm · 0.66mm/px · 3 of 15 slices shown (1 of 3)]
[im 1/15]
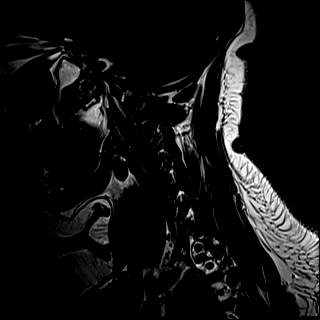
[im 8/15]
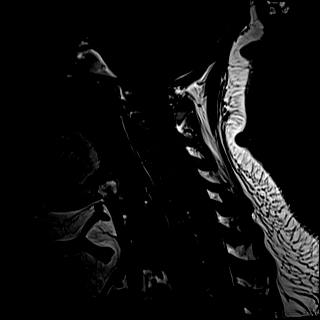
[im 15/15]
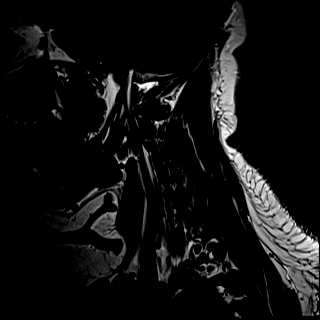

[Series 7: T2 · axial · 3.0mm · 0.50mm/px · z∈[-44,+77]mm · 9 of 40 slices shown]
[im 1/40]
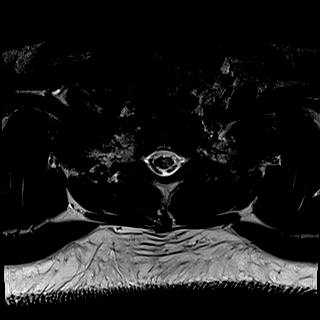
[im 5/40]
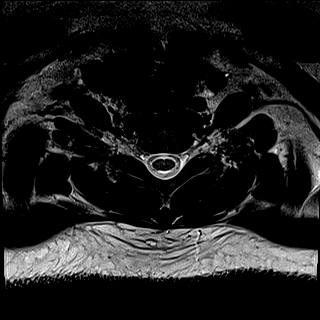
[im 10/40]
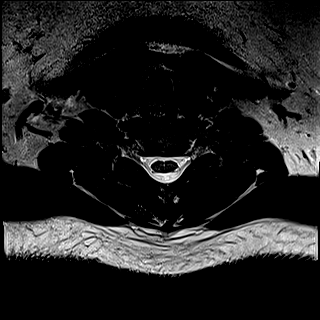
[im 15/40]
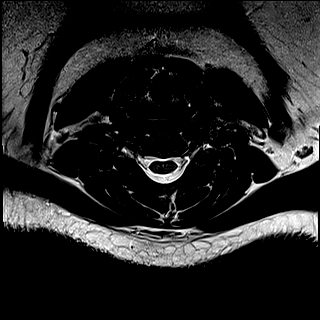
[im 20/40]
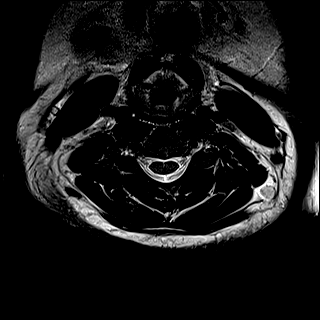
[im 25/40]
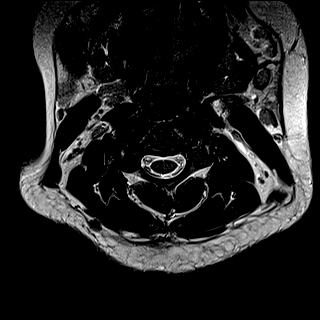
[im 30/40]
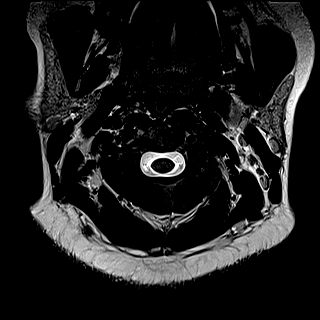
[im 35/40]
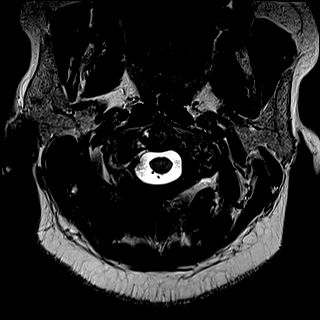
[im 40/40]
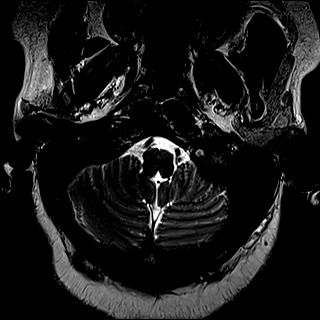

[Series 9: T1 · axial · non-contrast · 3.0mm · 0.31mm/px · z∈[-44,+77]mm · 9 of 40 slices shown (2 of 3)]
[im 1/40]
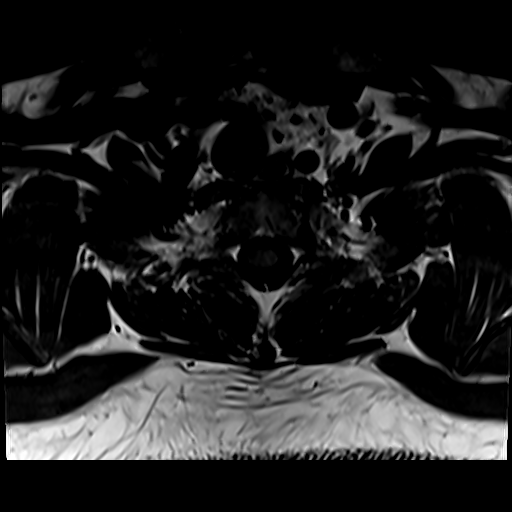
[im 5/40]
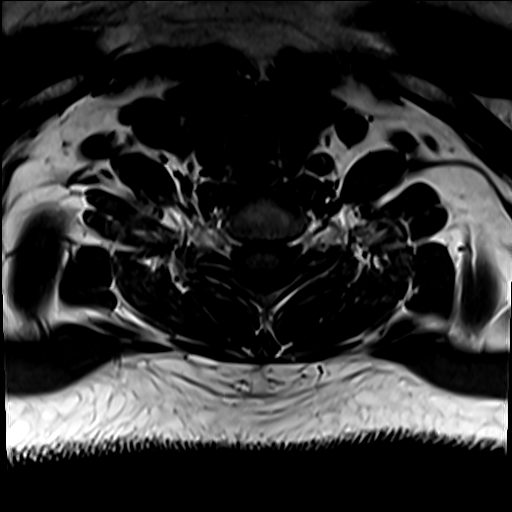
[im 10/40]
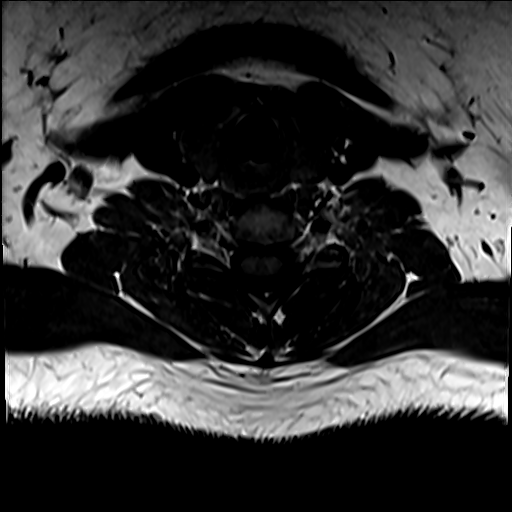
[im 15/40]
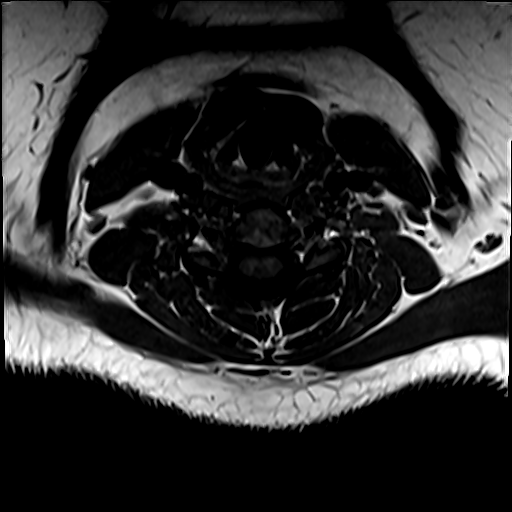
[im 20/40]
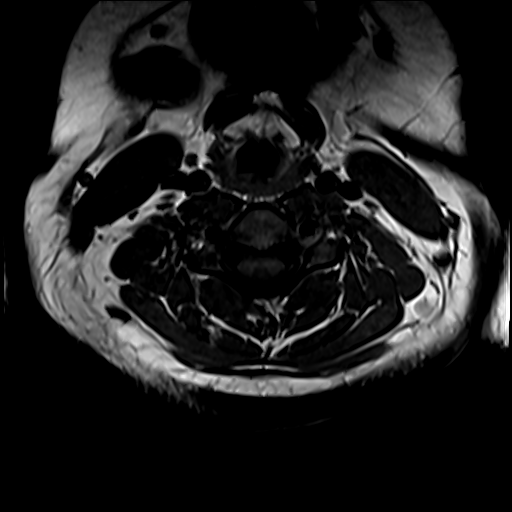
[im 25/40]
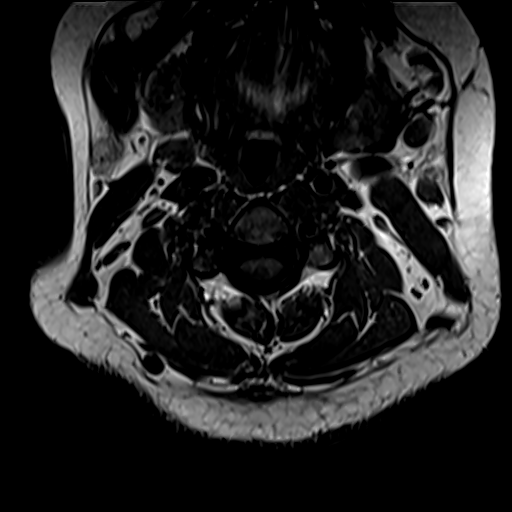
[im 30/40]
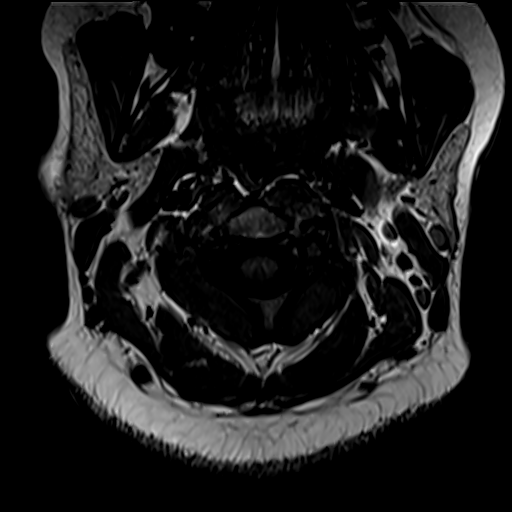
[im 35/40]
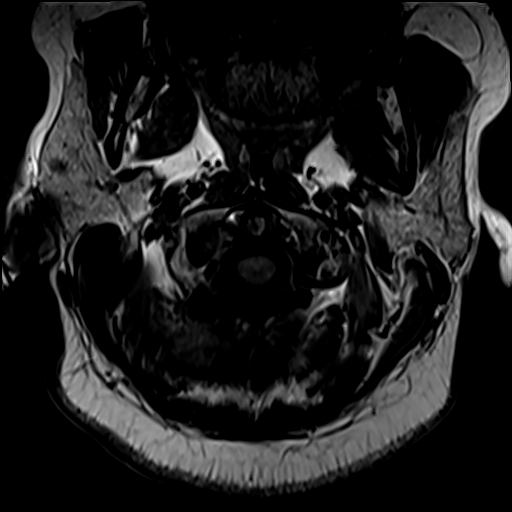
[im 40/40]
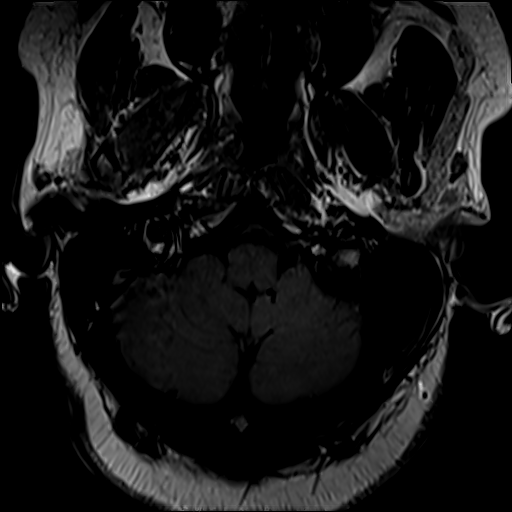

[Series 10: T2 post-contrast · sagittal · 3.0mm · 0.55mm/px · 3 of 15 slices shown]
[im 1/15]
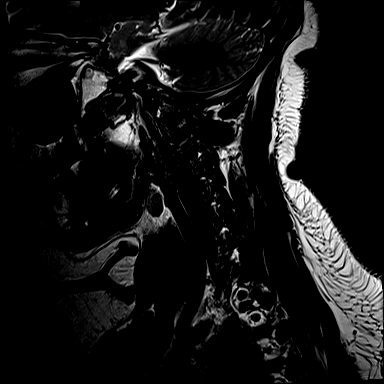
[im 8/15]
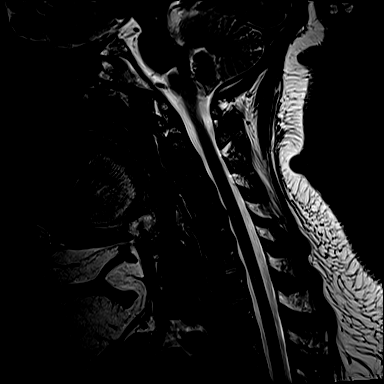
[im 15/15]
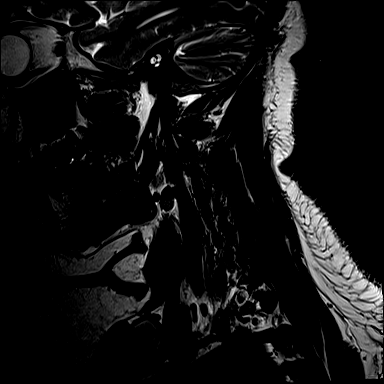

[Series 12: T1 · axial · 3.0mm · 0.31mm/px · z∈[-44,-31]mm · 2 of 40 slices shown (3 of 3)]
[im 1/40]
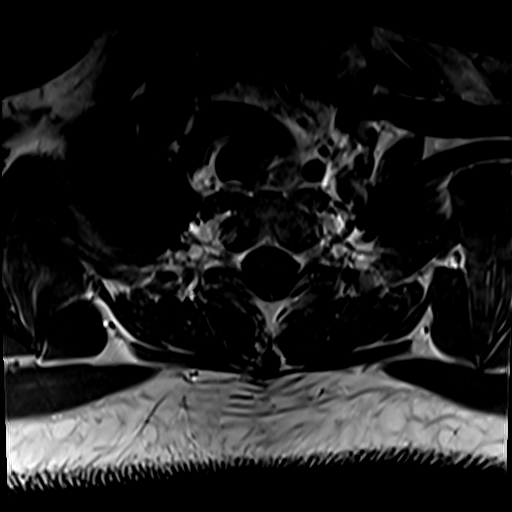
[im 5/40]
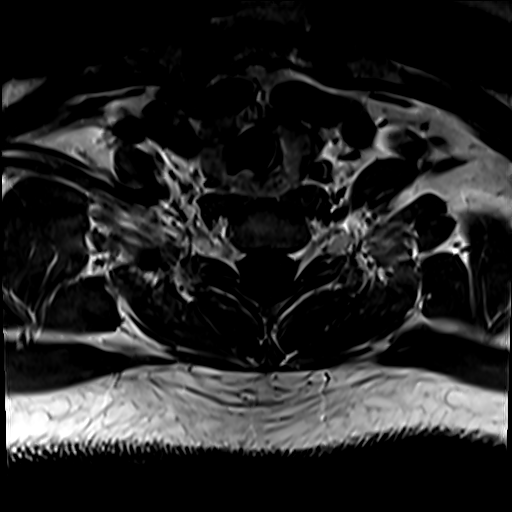

[26 of 48 positions shown; findings below may reference images not displayed]

FINDINGS: Alignment: Reversal of the expected cervical lordosis. No
significant spondylolisthesis.

Vertebrae: Vertebral body height is maintained. No significant
marrow edema or focal suspicious osseous lesion.

Cord: No lesions are identified within the cervical spinal cord. No
abnormal spinal cord enhancement.

Posterior Fossa, vertebral arteries, paraspinal tissues: Extensive
supratentorial and infratentorial white matter disease within the
brain, more fully characterized on the recent prior brain MRI of
[DATE]. A 6 mm pituitary cyst was also more fully characterized
on this prior study. Flow voids preserved within the imaged cervical
vertebral arteries. Paraspinal soft tissues unremarkable.

Disc levels:

No more than mild disc degeneration within the cervical spine.

C2-C3: Small central disc protrusion. Mild focal effacement of the
ventral thecal sac (without significant spinal cord mass effect). No
significant foraminal stenosis.

C3-C4: Slight disc bulge. No significant spinal canal or foraminal
stenosis.

C4-C5: Small central disc protrusion. The disc protrusion results in
mild focal effacement of the ventral thecal sac, contacting the
ventral aspect of the spinal cord. No significant foraminal
stenosis.

C5-C6: No significant disc herniation or stenosis.

C6-C7: No significant disc herniation or stenosis.

C7-T1: No significant disc herniation or stenosis.
IMPRESSION: No lesions are identified within the cervical spinal cord.

Extensive supratentorial and infratentorial white matter disease
within the brain, more fully characterized on the prior brain MRI of
[DATE].

Cervical spondylosis, as outlined. No more than mild spinal canal
narrowing. A small C4-C5 central disc protrusion contacts the
ventral aspect of the spinal cord. No significant foraminal
stenosis. No more than mild disc degeneration.

Nonspecific reversal of the expected cervical lordosis.

## 2021-06-27 MED ORDER — GADOBENATE DIMEGLUMINE 529 MG/ML IV SOLN
20.0000 mL | Freq: Once | INTRAVENOUS | Status: AC | PRN
  Administered 2021-06-27: 20 mL via INTRAVENOUS

## 2021-06-30 ENCOUNTER — Telehealth: Payer: Self-pay | Admitting: Neurology

## 2021-06-30 NOTE — Telephone Encounter (Signed)
Pt called in and left a message with the access nurse on 06/28/21. She had her MRI done and wants to know if she needs to start Kesimpta injections? Full access nurse message is in Dr. Moises Blood box ?

## 2021-06-30 NOTE — Progress Notes (Signed)
Advised of Results, Please see telephone.

## 2021-07-01 NOTE — Telephone Encounter (Signed)
Please keep appt in October only ?

## 2021-07-04 ENCOUNTER — Ambulatory Visit: Payer: 59 | Admitting: Neurology

## 2021-07-07 ENCOUNTER — Telehealth: Payer: Self-pay | Admitting: Neurology

## 2021-07-07 DIAGNOSIS — G35 Multiple sclerosis: Secondary | ICD-10-CM

## 2021-07-07 NOTE — Telephone Encounter (Signed)
Per pt she having pain in her legs and back. The pain had gotten worse today.  ?

## 2021-07-07 NOTE — Telephone Encounter (Signed)
Patient called and said she is needing something else for the pain besides gabapentin. ? ?She is in a lot of pain, she said. ? ?[facility] [address] ?

## 2021-07-08 NOTE — Telephone Encounter (Signed)
Patient advise of Pain management referral. ?Patient wanted to know if she could increase the Gabapentin right now.  ?

## 2021-07-09 NOTE — Telephone Encounter (Signed)
Patient advise to Increase gabapentin to 300mg  three times daily. ? ?

## 2021-07-11 ENCOUNTER — Ambulatory Visit: Payer: Self-pay | Admitting: Neurology

## 2021-07-22 ENCOUNTER — Telehealth: Payer: Self-pay | Admitting: Neurology

## 2021-07-22 ENCOUNTER — Other Ambulatory Visit (HOSPITAL_COMMUNITY): Payer: Self-pay

## 2021-07-22 ENCOUNTER — Encounter: Payer: Self-pay | Admitting: Neurology

## 2021-07-22 ENCOUNTER — Telehealth (HOSPITAL_COMMUNITY): Payer: Self-pay | Admitting: Pharmacy Technician

## 2021-07-22 NOTE — Telephone Encounter (Signed)
Patient Advocate Encounter ?  ?Received notification that prior authorization for Kesimpta 20MG /0.4ML auto-injectors is required. ?  ?PA submitted on 07/22/2021 ?Key [id number] ?Status is pending ?   ? ? ? ?Lyndel Safe, CPhT ?Pharmacy Patient Advocate Specialist ?[facility] Patient Advocate Team ?Direct Number: [phone]  Fax: [phone]  ?

## 2021-07-22 NOTE — Telephone Encounter (Signed)
Patient called back

## 2021-07-22 NOTE — Telephone Encounter (Signed)
Telephone call to pt, Patient referral form for Kesmipta sent the day labs came back. Pt should get samples or free medication until PA is approved. ? ?Please call [employer] to see if that went through. ?

## 2021-07-22 NOTE — Telephone Encounter (Signed)
Per Pt Kesimpta never received forms. Forms refaxed.  ?

## 2021-07-22 NOTE — Telephone Encounter (Signed)
-----   Message from Leida Lauth, [place] sent at 07/22/2021  2:08 PM EDT ----- ?Regarding: PA Kesimpta ?Good Afternoon, ?If you could start a PA for this patient please. ? ?

## 2021-07-22 NOTE — Telephone Encounter (Signed)
Patient called for an update on her Kesimpta PA.  ? ?She said she used the sample and doesn't have any more. ?

## 2021-07-24 NOTE — Telephone Encounter (Signed)
Patient Advocate Encounter ? ?Prior Authorization for Kesimpta 20MG /0.4ML auto-injectors has been approved.   ? ?PA# ?Effective dates: 07/24/2021 through 08/22/2021 ? ? ? ?10/22/2021, CPhT-Adv ?Pharmacy Patient Advocate Specialist ?[facility] Patient Advocate Team ?Direct Number: [phone]  Fax: [phone] ? ?

## 2021-07-28 NOTE — Telephone Encounter (Signed)
Pt called in stating she has spoken with [employer] again and they still haven't gotten the forms. She wants to make sure we are faxing them to the correct fax number. The fax number is [phone]. ?

## 2021-07-28 NOTE — Telephone Encounter (Signed)
Refaxed forms third attempt.  ?

## 2021-07-31 ENCOUNTER — Telehealth: Payer: Self-pay | Admitting: Neurology

## 2021-07-31 NOTE — Telephone Encounter (Signed)
Per patient Lindsey Schmitt advised that they never received her start form. ? ?Form resent to [phone]. ?

## 2021-07-31 NOTE — Telephone Encounter (Signed)
Pt has a few questions for sheena regarding a medication ?

## 2021-08-01 NOTE — Telephone Encounter (Signed)
Telephone call to [employer] rep, As long as patient last injection was 07/22/21. ?Patient should start her maintenance dose 5/16. ?Will verify with Patient. ?Once she starts her first Maintenance dose she will continue every 4 weeks.  ?Patient will be contacted by [employer] caseworker Dantia to go over everything shipping from [facility] and give her their contact information. ? ?Shipping can be 5-7 business days. ?I will try to have samples on hand for  patient if she do not receive her shot from [facility] ?

## 2021-08-04 ENCOUNTER — Telehealth: Payer: Self-pay | Admitting: Neurology

## 2021-08-04 NOTE — Telephone Encounter (Signed)
Jasmine December from [employer] called to let Lindsey Schmitt know she spoke to the patient and explained everything and she has a 0 copay.  The prescription was transferred to [facility] as of 08/01/2021.   ?

## 2021-08-15 ENCOUNTER — Telehealth: Payer: Self-pay | Admitting: Neurology

## 2021-08-15 NOTE — Telephone Encounter (Signed)
These symptoms are not related to MS or migraines, agree with Urgent care eval, thanks

## 2021-08-15 NOTE — Telephone Encounter (Signed)
Pt called in stating she has been having trouble swallowing. She would like for someone to give her a call back to see what she can do.

## 2021-08-15 NOTE — Telephone Encounter (Signed)
Pt throat started hurting her yesterday and she was not able to eat, just drink fluids, she said it feels like a ready bad sore throat, she has not started any new medications, no new foods, no throat swelling, a little SOB when she get worked up when it hard to swallow with her throat hurting,  pt does not have a PCP, pt advised to go to urgent care to have her throat swabbed to be checked for strep throat. Pt told that Dr Everlena Cooper is out of the office but I would send this call to him and one of the other doctors in the office would see this as well.

## 2021-09-25 ENCOUNTER — Telehealth: Payer: Self-pay

## 2021-09-25 NOTE — Telephone Encounter (Signed)
Called Pt about the form we received from [employer] about the medication, Kesimpta. Pt reported that she did not have an adverse reaction to this medication. I read the first paragraph to her and she still denied any reaction.

## 2021-12-26 ENCOUNTER — Ambulatory Visit: Payer: 59 | Admitting: Neurology

## 2022-02-03 ENCOUNTER — Telehealth: Payer: Self-pay

## 2022-02-03 NOTE — Telephone Encounter (Signed)
Patient Advocate Encounter  Received a fax that patient is approved for patient assistance for Kesimpta.   Effective until 01-29-2023  Approval letter is attached to patient's chart which includes ordering refill information and administration information.

## 2022-05-08 ENCOUNTER — Encounter: Payer: Self-pay | Admitting: Neurology

## 2022-05-15 ENCOUNTER — Ambulatory Visit: Payer: 59 | Admitting: Neurology

## 2022-06-18 ENCOUNTER — Telehealth: Payer: Self-pay

## 2022-06-18 NOTE — Telephone Encounter (Signed)
Refill received from [facility].   Refill needed for Kesimpta 20 mg

## 2022-06-21 ENCOUNTER — Other Ambulatory Visit: Payer: Self-pay | Admitting: Neurology

## 2022-06-23 NOTE — Telephone Encounter (Signed)
LMOVM please call the office to schedule a f/u visit and to have labs drawn.

## 2022-06-24 NOTE — Telephone Encounter (Signed)
2nd message left, Patient not seen in a year, does not have a follow up scheduled and has not had routine labs checked.  Will need to contact patient to have labs (CBC with diff, immunoglobulin panel) done and follow up.

## 2022-06-25 NOTE — Telephone Encounter (Signed)
Pt called stating she doesn't live in [place] anymore, she has moved and has a Animal nutritionist. Does not need to follow up with Dr Tomi Likens anymore.
